# Patient Record
Sex: Female | Born: 1985 | Race: Black or African American | Hispanic: No | Marital: Single | Smoking: Former smoker
Health system: Southern US, Community
[De-identification: ages and names within clinical notes are randomized; demographics above are authoritative.]

## PROBLEM LIST (undated history)

## (undated) DIAGNOSIS — Z789 Other specified health status: Secondary | ICD-10-CM

## (undated) HISTORY — DX: Other specified health status: Z78.9

## (undated) HISTORY — PX: WRIST FRACTURE SURGERY: SHX121

---

## 2006-07-16 ENCOUNTER — Other Ambulatory Visit: Payer: Self-pay

## 2006-07-16 ENCOUNTER — Emergency Department: Payer: Self-pay | Admitting: Emergency Medicine

## 2006-12-10 ENCOUNTER — Ambulatory Visit: Payer: Self-pay | Admitting: Family Medicine

## 2008-05-29 ENCOUNTER — Emergency Department: Payer: Self-pay | Admitting: Emergency Medicine

## 2010-02-23 ENCOUNTER — Emergency Department: Payer: Self-pay | Admitting: Emergency Medicine

## 2011-07-18 DIAGNOSIS — Z8742 Personal history of other diseases of the female genital tract: Secondary | ICD-10-CM | POA: Insufficient documentation

## 2014-11-13 ENCOUNTER — Emergency Department: Payer: Self-pay | Admitting: Emergency Medicine

## 2015-02-08 ENCOUNTER — Emergency Department: Admit: 2015-02-08 | Discharge status: Against Medical Advice | Payer: Self-pay | Admitting: Emergency Medicine

## 2015-04-04 ENCOUNTER — Emergency Department
Admission: EM | Admit: 2015-04-04 | Discharge: 2015-04-04 | Disposition: A | Discharge status: Home / Self Care | Payer: Self-pay

## 2015-12-17 ENCOUNTER — Emergency Department
Admission: EM | Admit: 2015-12-17 | Discharge: 2015-12-17 | Disposition: A | Discharge status: Home / Self Care | Payer: 59 | Attending: Emergency Medicine | Admitting: Emergency Medicine

## 2015-12-17 DIAGNOSIS — M5442 Lumbago with sciatica, left side: Secondary | ICD-10-CM | POA: Diagnosis not present

## 2015-12-17 DIAGNOSIS — Z3202 Encounter for pregnancy test, result negative: Secondary | ICD-10-CM | POA: Insufficient documentation

## 2015-12-17 DIAGNOSIS — M545 Low back pain: Secondary | ICD-10-CM | POA: Diagnosis present

## 2015-12-17 LAB — POCT PREGNANCY, URINE: PREG TEST UR: NEGATIVE

## 2015-12-17 MED ORDER — HYDROCODONE-ACETAMINOPHEN 5-325 MG PO TABS: 1.0000 | ORAL_TABLET | Freq: Four times a day (QID) | ORAL | Status: DC | PRN

## 2015-12-17 MED ORDER — IBUPROFEN 800 MG PO TABS
ORAL_TABLET | ORAL | Status: AC
  Administered 2015-12-17: 800 mg via ORAL
  Filled 2015-12-17: qty 1

## 2015-12-17 MED ORDER — IBUPROFEN 800 MG PO TABS
800.0000 mg | ORAL_TABLET | ORAL | Status: AC
  Administered 2015-12-17: 800 mg via ORAL

## 2015-12-17 MED ORDER — PREDNISONE 20 MG PO TABS
40.0000 mg | ORAL_TABLET | Freq: Every day | ORAL | Status: DC
Start: 2015-12-17 — End: 2019-02-07

## 2015-12-17 NOTE — Discharge Instructions (Signed)
You have been seen in the Emergency Department (ED)  today for back pain.  Your workup and exam have not shown any acute abnormalities and you are likely suffering from muscle strain or possible problems with your discs, but there is no treatment that will fix your symptoms at this time.  Please take Motrin (ibuprofen) as needed for your pain according to the instructions written on the box.  Alternatively, for the next five days you can take  three times daily with meals (it may upset your stomach).  Take hydrocodone as prescribed for severe pain. Do not drink alcohol, drive or participate in any other potentially dangerous activities while taking this medication as it may make you sleepy. Do not take this medication with any other sedating medications, either prescription or over-the-counter. If you were prescribed Percocet or Vicodin, do not take these with acetaminophen (Tylenol) as it is already contained within these medications.   This medication is an opiate (or narcotic) pain medication and can be habit forming.  Use it as little as possible to achieve adequate pain control.  Do not use or use it with extreme caution if you have a history of opiate abuse or dependence.  If you are on a pain contract with your primary care doctor or a pain specialist, be sure to let them know you were prescribed this medication today from the [facility] Emergency Department.  This medication is intended for your use only - do not give any to anyone else and keep it in a secure place where nobody else, especially children, have access to it.  It will also cause or worsen constipation, so you may want to consider taking an over-the-counter stool softener while you are taking this medication.  Please follow up with your doctor as soon as possible regarding today's ED visit and your back pain.  Return to the ED for worsening back pain, fever, weakness or numbness of either leg, or if you develop either (1) an  inability to urinate or have bowel movements, or (2) loss of your ability to control your bathroom functions (if you start having "accidents"), or if you develop other new symptoms that concern you.   Sciatica Sciatica is pain, weakness, numbness, or tingling along the path of the sciatic nerve. The nerve starts in the lower back and runs down the back of each leg. The nerve controls the muscles in the lower leg and in the back of the knee, while also providing sensation to the back of the thigh, lower leg, and the sole of your foot. Sciatica is a symptom of another medical condition. For instance, nerve damage or certain conditions, such as a herniated disk or bone spur on the spine, pinch or put pressure on the sciatic nerve. This causes the pain, weakness, or other sensations normally associated with sciatica. Generally, sciatica only affects one side of the body. CAUSES   Herniated or slipped disc.  Degenerative disk disease.  A pain disorder involving the narrow muscle in the buttocks (piriformis syndrome).  Pelvic injury or fracture.  Pregnancy.  Tumor (rare). SYMPTOMS  Symptoms can vary from mild to very severe. The symptoms usually travel from the low back to the buttocks and down the back of the leg. Symptoms can include:  Mild tingling or dull aches in the lower back, leg, or hip.  Numbness in the back of the calf or sole of the foot.  Burning sensations in the lower back, leg, or hip.  Sharp pains  in the lower back, leg, or hip.  Leg weakness.  Severe back pain inhibiting movement. These symptoms may get worse with coughing, sneezing, laughing, or prolonged sitting or standing. Also, being overweight may worsen symptoms. DIAGNOSIS  Your caregiver will perform a physical exam to look for common symptoms of sciatica. He or she may ask you to do certain movements or activities that would trigger sciatic nerve pain. Other tests may be performed to find the cause of the  sciatica. These may include:  Blood tests.  X-rays.  Imaging tests, such as an MRI or CT scan. TREATMENT  Treatment is directed at the cause of the sciatic pain. Sometimes, treatment is not necessary and the pain and discomfort goes away on its own. If treatment is needed, your caregiver may suggest:  Over-the-counter medicines to relieve pain.  Prescription medicines, such as anti-inflammatory medicine, muscle relaxants, or narcotics.  Applying heat or ice to the painful area.  Steroid injections to lessen pain, irritation, and inflammation around the nerve.  Reducing activity during periods of pain.  Exercising and stretching to strengthen your abdomen and improve flexibility of your spine. Your caregiver may suggest losing weight if the extra weight makes the back pain worse.  Physical therapy.  Surgery to eliminate what is pressing or pinching the nerve, such as a bone spur or part of a herniated disk. HOME CARE INSTRUCTIONS   Only take over-the-counter or prescription medicines for pain or discomfort as directed by your caregiver.  Apply ice to the affected area for 20 minutes, 3-4 times a day for the first 48-72 hours. Then try heat in the same way.  Exercise, stretch, or perform your usual activities if these do not aggravate your pain.  Attend physical therapy sessions as directed by your caregiver.  Keep all follow-up appointments as directed by your caregiver.  Do not wear high heels or shoes that do not provide proper support.  Check your mattress to see if it is too soft. A firm mattress may lessen your pain and discomfort. SEEK IMMEDIATE MEDICAL CARE IF:   You lose control of your bowel or bladder (incontinence).  You have increasing weakness in the lower back, pelvis, buttocks, or legs.  You have redness or swelling of your back.  You have a burning sensation when you urinate.  You have pain that gets worse when you lie down or awakens you at  night.  Your pain is worse than you have experienced in the past.  Your pain is lasting longer than 4 weeks.  You are suddenly losing weight without reason. MAKE SURE YOU:  Understand these instructions.  Will watch your condition.  Will get help right away if you are not doing well or get worse.   This information is not intended to replace advice given to you by your health care provider. Make sure you discuss any questions you have with your health care provider.   Document Released: 10/15/2001 Document Revised: 07/12/2015 Document Reviewed: 03/01/2012 Elsevier Interactive Patient Education [employer]

## 2015-12-17 NOTE — ED Provider Notes (Signed)
[facility] Emergency Department Provider Note  ____________________________________________  Time seen: Approximately 6:33 PM  I have reviewed the triage vital signs and the nursing notes.   HISTORY  Chief Complaint Back Pain    HPI Crystal Choi is a 30 y.o. female denies previous medical history.  Patient reports that for about the last 2 weeks she was having pain in her lower back. Since in the area of her tailbone and lower buttock and frequently radiates down the left into the left leg in the back of the thigh. This is associated with a slight tingling sensation at times in the leg. No numbness or weakness. No trouble walking. Pain is worsened with standing and being at work. It is really some by resting.  No fevers chills or abdominal pain. She has not noticed any rash. No history of IV drug abuse or cancer. She has had back pain occasionally off and on throughout her life, but reports that the pain now seems to be worse. No trouble moving her bowel or bladder. No incontinence.   No past medical history on file.  There are no active problems to display for this patient.   No past surgical history on file.  Current Outpatient Rx  Name  Route  Sig  Dispense  Refill  . HYDROcodone-acetaminophen (NORCO/VICODIN) 5-325 MG tablet   Oral   Take 1 tablet by mouth every 6 (six) hours as needed for moderate pain.   20 tablet   0   . predniSONE (DELTASONE) 20 MG tablet   Oral   Take 2 tablets (40 mg total) by mouth daily with breakfast.   10 tablet   0     Allergies Review of patient's allergies indicates no known allergies.  No family history on file.  Social History Social History  Substance Use Topics  . Smoking status: Not on file  . Smokeless tobacco: Not on file  . Alcohol Use: Not on file    Review of Systems Constitutional: No fever/chills Eyes: No visual changes. ENT: No sore throat. Cardiovascular: Denies chest  pain. Respiratory: Denies shortness of breath. Gastrointestinal: No abdominal pain.  No nausea, no vomiting.  No diarrhea.  No constipation. Genitourinary: Negative for dysuria. Musculoskeletal: Negative for back pain in the upper or mid back, but pain is around the lower back and buttock. Skin: Negative for rash. Neurological: Negative for headaches, focal weakness or numbness.  10-point ROS otherwise negative.  ____________________________________________   PHYSICAL EXAM:  VITAL SIGNS: ED Triage Vitals  Enc Vitals Group     BP 12/17/15 1716 144/104 mmHg     Pulse Rate 12/17/15 1715 99     Resp 12/17/15 1715 18     Temp 12/17/15 1715 98.4 F (36.9 C)     Temp Source 12/17/15 1715 Oral     SpO2 12/17/15 1715 100 %     Weight 12/17/15 1715 242 lb (109.77 kg)     Height 12/17/15 1715  (1.651 m)     Head Cir --      Peak Flow --      Pain Score 12/17/15 1715 10     Pain Loc --      Pain Edu? --      Excl. in GC? --    Constitutional: Alert and oriented. Well appearing and in no acute distress. Eyes: Conjunctivae are normal. PERRL. EOMI. Head: Atraumatic. Nose: No congestion/rhinnorhea. Mouth/Throat: Mucous membranes are moist.  Oropharynx non-erythematous. Neck: No stridor.  Cardiovascular: Normal rate, regular rhythm. Grossly normal heart sounds.  Good peripheral circulation. Respiratory: Normal respiratory effort.  No retractions. Lungs CTAB. Gastrointestinal: Soft and nontender. No distention. No abdominal bruits. No CVA tenderness. Musculoskeletal: No lower extremity tenderness nor edema.  No joint effusions. Neurologic:  Normal speech and language. No gross focal neurologic deficits are appreciated. No gait instability. Normal patellar reflexes bilateral. Normal sensation in lower extremities with 5 out of 5 strength of major muscle groups of the lower legs bilateral. Rectal exam demonstrates normal tone and no saddle anesthesia, performed with Carollee Herter RN present.  Patient has no lesion edema or evidence of cellulitis over the lower back or buttock. Skin:  Skin is warm, dry and intact. No rash noted. Psychiatric: Mood and affect are normal. Speech and behavior are normal.  ____________________________________________   LABS (all labs ordered are listed, but only abnormal results are displayed)  Labs Reviewed  POC URINE PREG, ED  POCT PREGNANCY, URINE   ____________________________________________  EKG   ____________________________________________  RADIOLOGY  No trauma or evidence for emergent imaging. Reassuring neurologic exam of the lower extremities. No back pain "red flecks". Pregnancy test normal negative. ____________________________________________   PROCEDURES  Procedure(s) performed: None  Critical Care performed: No  ____________________________________________   INITIAL IMPRESSION / ASSESSMENT AND PLAN / ED COURSE  Pertinent labs & imaging results that were available during my care of the patient were reviewed by me and considered in my medical decision making (see chart for details).  Patient is for low back pain. Primarily has a sciatica type component to radiation down the left posterior leg. Reassuring exam. No evidence of acute neurologic abnormality or signs or symptoms to suggest acute cauda equina or severe herniated nucleus pulposus.  No fever no history of IV drug abuse. Patient awake alert normal hemodynamics.  Careful back pain precautions discussed with patient who is agreeable with plan, will follow up closely with orthopedics.  I will prescribe the patient a narcotic pain medicine due to their condition which I anticipate will cause at least moderate pain short term. I discussed with the patient safe use of narcotic pain medicines, and that they are not to drive, work in dangerous areas, or ever take more than prescribed (no more than 1 pill every 6 hours). We discussed that this is the type of medication  that can be  overdosed on and the risks of this type of medicine. Patient is very agreeable to only use as prescribed and to never use more than prescribed.  ____________________________________________   FINAL CLINICAL IMPRESSION(S) / ED DIAGNOSES  Final diagnoses:  Left-sided low back pain with left-sided sciatica      Sharyn Creamer, MD 12/17/15 2314

## 2015-12-17 NOTE — ED Notes (Signed)
Attended MD for gluteus exam at bedside

## 2015-12-17 NOTE — ED Notes (Signed)
Pt states that a couple weeks ago she started having lower back pain, denies injury, states pain is now in her left buttock with pain at times in her left leg

## 2017-10-11 ENCOUNTER — Emergency Department
Admission: EM | Admit: 2017-10-11 | Discharge: 2017-10-11 | Disposition: A | Discharge status: Home / Self Care | Payer: Medicaid Other | Attending: Emergency Medicine | Admitting: Emergency Medicine

## 2017-10-11 ENCOUNTER — Other Ambulatory Visit: Payer: Self-pay

## 2017-10-11 ENCOUNTER — Emergency Department: Payer: Medicaid Other

## 2017-10-11 DIAGNOSIS — Y92531 Health care provider office as the place of occurrence of the external cause: Secondary | ICD-10-CM | POA: Diagnosis not present

## 2017-10-11 DIAGNOSIS — Z79899 Other long term (current) drug therapy: Secondary | ICD-10-CM | POA: Insufficient documentation

## 2017-10-11 DIAGNOSIS — S0990XA Unspecified injury of head, initial encounter: Secondary | ICD-10-CM | POA: Diagnosis present

## 2017-10-11 DIAGNOSIS — Y9389 Activity, other specified: Secondary | ICD-10-CM | POA: Insufficient documentation

## 2017-10-11 DIAGNOSIS — Y838 Other surgical procedures as the cause of abnormal reaction of the patient, or of later complication, without mention of misadventure at the time of the procedure: Secondary | ICD-10-CM | POA: Insufficient documentation

## 2017-10-11 DIAGNOSIS — S02609B Fracture of mandible, unspecified, initial encounter for open fracture: Secondary | ICD-10-CM | POA: Diagnosis not present

## 2017-10-11 DIAGNOSIS — G4489 Other headache syndrome: Secondary | ICD-10-CM

## 2017-10-11 DIAGNOSIS — Y999 Unspecified external cause status: Secondary | ICD-10-CM | POA: Insufficient documentation

## 2017-10-11 LAB — COMPREHENSIVE METABOLIC PANEL
ALT: 14 U/L (ref 14–54)
AST: 16 U/L (ref 15–41)
Albumin: 3.5 g/dL (ref 3.5–5.0)
Alkaline Phosphatase: 48 U/L (ref 38–126)
Anion gap: 5 (ref 5–15)
BUN: 9 mg/dL (ref 6–20)
CO2: 25 mmol/L (ref 22–32)
Calcium: 9.1 mg/dL (ref 8.9–10.3)
Chloride: 108 mmol/L (ref 101–111)
Creatinine, Ser: 0.58 mg/dL (ref 0.44–1.00)
GFR calc Af Amer: >60 mL/min (ref >60)
GFR calc non Af Amer: >60 mL/min (ref >60)
Glucose, Bld: 96 mg/dL (ref 65–99)
Potassium: 3.8 mmol/L (ref 3.5–5.1)
Sodium: 138 mmol/L (ref 135–145)
Total Bilirubin: 1 mg/dL (ref 0.3–1.2)
Total Protein: 7.4 g/dL (ref 6.5–8.1)

## 2017-10-11 LAB — CBC WITH DIFFERENTIAL/PLATELET
Basophils Absolute: 0.1 10*3/uL (ref 0–0.1)
Basophils Relative: 1 %
Eosinophils Absolute: 0 10*3/uL (ref 0–0.7)
Eosinophils Relative: 0 %
HCT: 36.9 % (ref 35.0–47.0)
Hemoglobin: 12.1 g/dL (ref 12.0–16.0)
Lymphocytes Relative: 12 %
Lymphs Abs: 1.3 10*3/uL (ref 1.0–3.6)
MCH: 28.2 pg (ref 26.0–34.0)
MCHC: 32.8 g/dL (ref 32.0–36.0)
MCV: 85.9 fL (ref 80.0–100.0)
Monocytes Absolute: 0.4 10*3/uL (ref 0.2–0.9)
Monocytes Relative: 4 %
Neutro Abs: 8.6 10*3/uL — ABNORMAL HIGH (ref 1.4–6.5)
Neutrophils Relative %: 83 %
Platelets: 194 10*3/uL (ref 150–440)
RBC: 4.29 MIL/uL (ref 3.80–5.20)
RDW: 14.5 % (ref 11.5–14.5)
WBC: 10.3 10*3/uL (ref 3.6–11.0)

## 2017-10-11 MED ORDER — PROMETHAZINE HCL 25 MG/ML IJ SOLN
12.5000 mg | Freq: Once | INTRAMUSCULAR | Status: AC
  Administered 2017-10-11: 12.5 mg via INTRAMUSCULAR
  Filled 2017-10-11: qty 1

## 2017-10-11 MED ORDER — AMOXICILLIN-POT CLAVULANATE 875-125 MG PO TABS: 1.0000 | ORAL_TABLET | Freq: Two times a day (BID) | ORAL | 0 refills | Status: AC

## 2017-10-11 NOTE — ED Provider Notes (Signed)
[facility] Emergency Department Provider Note  ____________________________________________  Time seen: Approximately 5:25 PM  I have reviewed the triage vital signs and the nursing notes.   HISTORY  Chief Complaint Headache and Emesis    HPI Crystal Choi is a 31 y.o. female presents to the emergency department with 10 out of 10 left-sided frontal headache with new onset left blurry vision that started last night.  Patient reports that one month ago, patient had wisdom teeth extracted from the left lower jaw.  Patient reports that she has had mild, intermittent left-sided headaches since that time but nothing severe.  Patient reports   that current headache feels like "something is wrong".  She reports 2 episodes of emesis. she has been ambulating without difficulty denies weakness.  No history of migraines. No history of essential hypertension.    No past medical history on file.  There are no active problems to display for this patient.   Prior to Admission medications   Medication Sig Start Date End Date Taking? Authorizing Provider  amoxicillin-clavulanate (AUGMENTIN) 875-125 MG tablet Take 1 tablet by mouth 2 (two) times daily for 10 days. 10/11/17 10/21/17  Orvil Feil, PA-C  HYDROcodone-acetaminophen (NORCO/VICODIN) 5-325 MG tablet Take 1 tablet by mouth every 6 (six) hours as needed for moderate pain. 12/17/15   Sharyn Creamer, MD  predniSONE (DELTASONE) 20 MG tablet Take 2 tablets (40 mg total) by mouth daily with breakfast. 12/17/15   Sharyn Creamer, MD    Allergies Patient has no known allergies.  No family history on file.  Social History Social History   Tobacco Use  . Smoking status: Never Smoker  . Smokeless tobacco: Never Used  Substance Use Topics  . Alcohol use: No    Frequency: Never  . Drug use: No     Review of Systems  Constitutional: No fever/chills Eyes: Patient has mild left eye blurry vision.  ENT: No upper  respiratory complaints. Cardiovascular: no chest pain. Respiratory: no cough. No SOB. Gastrointestinal: No abdominal pain.  No nausea, no vomiting.  No diarrhea.  No constipation. Musculoskeletal: Negative for musculoskeletal pain. Skin: Negative for rash, abrasions, lacerations, ecchymosis. Neurological: Patient has headache, no focal weakness or numbness.  ____________________________________________   PHYSICAL EXAM:  VITAL SIGNS: ED Triage Vitals  Enc Vitals Group     BP 10/11/17 1442 (!) 185/108     Pulse Rate 10/11/17 1442 80     Resp 10/11/17 1442 18     Temp 10/11/17 1442 97.6 F (36.4 C)     Temp Source 10/11/17 1442 Oral     SpO2 10/11/17 1442 100 %     Weight 10/11/17 1443 220 lb (99.8 kg)     Height 10/11/17 1443 5\' 6"  (1.676 m)     Head Circumference --      Peak Flow --      Pain Score 10/11/17 1448 7     Pain Loc --      Pain Edu? --      Excl. in GC? --      Constitutional: Alert and oriented. Well appearing and in no acute distress. Eyes: Conjunctivae are normal. PERRL. EOMI. Head: Atraumatic. ENT:      Ears: TMs are pearly bilaterally.      Nose: No congestion/rhinnorhea.      Mouth/Throat: Mucous membranes are moist.  Neck: No stridor.  Full range of motion. Hematological/Lymphatic/Immunilogical: No cervical lymphadenopathy.  Cardiovascular: Normal rate, regular rhythm. Normal S1 and S2.  Good peripheral circulation. Respiratory: Normal respiratory effort without tachypnea or retractions. Lungs CTAB. Good air entry to the bases with no decreased or absent breath sounds. Gastrointestinal: Bowel sounds 4 quadrants. Soft and nontender to palpation. No guarding or rigidity. No palpable masses. No distention. No CVA tenderness. Musculoskeletal: Full range of motion to all extremities. No gross deformities appreciated. Neurologic:  Normal speech and language. No gross focal neurologic deficits are appreciated.  Skin:  Skin is warm, dry and intact. No rash  noted. Psychiatric: Mood and affect are normal. Speech and behavior are normal. Patient exhibits appropriate insight and judgement.   ____________________________________________   LABS (all labs ordered are listed, but only abnormal results are displayed)  Labs Reviewed  CBC WITH DIFFERENTIAL/PLATELET - Abnormal; Notable for the following components:      Result Value   Neutro Abs 8.6 (*)    All other components within normal limits  COMPREHENSIVE METABOLIC PANEL   ____________________________________________  EKG   ____________________________________________  RADIOLOGY Geraldo PitterI, Monicka Cyran M Danity Schmelzer, personally viewed and evaluated these images (plain radiographs) as part of my medical decision making, as well as reviewing the written report by the radiologist.    Ct Head Wo Contrast  Result Date: 10/11/2017 CLINICAL DATA:  Headache and left-sided facial pain. Recent third molar extractions. EXAM: CT HEAD WITHOUT CONTRAST CT MAXILLOFACIAL WITHOUT CONTRAST TECHNIQUE: Multidetector CT imaging of the head and maxillofacial structures were performed using the standard protocol without intravenous contrast. Multiplanar CT image reconstructions of the maxillofacial structures were also generated. COMPARISON:  None. FINDINGS: CT HEAD FINDINGS Brain: The ventricles are normal in size and configuration. There is no intracranial mass, hemorrhage, extra-axial fluid collection, or midline shift. Gray-white compartments are normal. No evident acute infarct. Vascular: No hyperdense vessel. No arteriovascular calcification evident. Skull: Bony calvarium appears intact. Other: Mastoid air cells are clear. CT MAXILLOFACIAL FINDINGS Osseous: No acute fracture or dislocation. There is evidence of prior third molar extraction on the left superiorly and inferiorly with bony remodeling in these areas. Marked bony thinning is noted in the areas of extraction with nonspecific soft tissue material in these areas, likely  developing granulation tissue. There is a subtle fracture along the posteromedial aspect of the extraction site in the right mandible, nondisplaced. No other fracture is evident. No dislocation. There are no blastic or lytic bone lesions. Orbits: Orbits appear symmetric bilaterally. No intraorbital lesions are evident. Sinuses: There is mucosal thickening and opacification in multiple ethmoid air cells bilaterally. There is mucosal thickening anteriorly in the sphenoid sinuses bilaterally. There is mucosal thickening in the maxillary antra bilaterally, more severe on the right posteriorly and medially than elsewhere. No air-fluid levels are noted. No bony destruction or expansion is noted paranasal sinus regions. There is ostiomeatal unit obstruction bilaterally. There is rightward deviation of the nasal septum. There is no nasal cavity obstruction. Soft tissues: No well-defined abscess seen. No soft tissue mass or hematoma. No adenopathy is noted on either side. Tongue and tongue base regions appear normal. Visualized pharynx appears normal. Visualized cervical spine appears normal. IMPRESSION: CT head:  Study within normal limits. CT maxillofacial: 1. Bony thinning and remodeling in areas of left and right third molar extractions. Nonspecific soft tissue material is present in the areas of extraction. A subtle nondisplaced fracture is noted in the posteromedial left mandible at the third molar extraction site, best seen on axial slice 47 series 7 and coronal slice 39 series 12. No other fracture evident. 2. No soft tissue abscess appreciable. No  appreciable soft tissue swelling or soft tissue hematoma demonstrated on this study. 3. Multifocal paranasal sinus disease. No air-fluid level. No bony destruction or expansion in the paranasal sinus regions. Note that there is ostiomeatal unit obstruction bilaterally. There is rightward deviation of the nasal septum. 4.  No evident adenopathy. Electronically Signed   By:  Bretta Bang III M.D.   On: 10/11/2017 17:57   Ct Maxillofacial Wo Contrast  Result Date: 10/11/2017 CLINICAL DATA:  Headache and left-sided facial pain. Recent third molar extractions. EXAM: CT HEAD WITHOUT CONTRAST CT MAXILLOFACIAL WITHOUT CONTRAST TECHNIQUE: Multidetector CT imaging of the head and maxillofacial structures were performed using the standard protocol without intravenous contrast. Multiplanar CT image reconstructions of the maxillofacial structures were also generated. COMPARISON:  None. FINDINGS: CT HEAD FINDINGS Brain: The ventricles are normal in size and configuration. There is no intracranial mass, hemorrhage, extra-axial fluid collection, or midline shift. Gray-white compartments are normal. No evident acute infarct. Vascular: No hyperdense vessel. No arteriovascular calcification evident. Skull: Bony calvarium appears intact. Other: Mastoid air cells are clear. CT MAXILLOFACIAL FINDINGS Osseous: No acute fracture or dislocation. There is evidence of prior third molar extraction on the left superiorly and inferiorly with bony remodeling in these areas. Marked bony thinning is noted in the areas of extraction with nonspecific soft tissue material in these areas, likely developing granulation tissue. There is a subtle fracture along the posteromedial aspect of the extraction site in the right mandible, nondisplaced. No other fracture is evident. No dislocation. There are no blastic or lytic bone lesions. Orbits: Orbits appear symmetric bilaterally. No intraorbital lesions are evident. Sinuses: There is mucosal thickening and opacification in multiple ethmoid air cells bilaterally. There is mucosal thickening anteriorly in the sphenoid sinuses bilaterally. There is mucosal thickening in the maxillary antra bilaterally, more severe on the right posteriorly and medially than elsewhere. No air-fluid levels are noted. No bony destruction or expansion is noted paranasal sinus regions. There  is ostiomeatal unit obstruction bilaterally. There is rightward deviation of the nasal septum. There is no nasal cavity obstruction. Soft tissues: No well-defined abscess seen. No soft tissue mass or hematoma. No adenopathy is noted on either side. Tongue and tongue base regions appear normal. Visualized pharynx appears normal. Visualized cervical spine appears normal. IMPRESSION: CT head:  Study within normal limits. CT maxillofacial: 1. Bony thinning and remodeling in areas of left and right third molar extractions. Nonspecific soft tissue material is present in the areas of extraction. A subtle nondisplaced fracture is noted in the posteromedial left mandible at the third molar extraction site, best seen on axial slice 47 series 7 and coronal slice 39 series 12. No other fracture evident. 2. No soft tissue abscess appreciable. No appreciable soft tissue swelling or soft tissue hematoma demonstrated on this study. 3. Multifocal paranasal sinus disease. No air-fluid level. No bony destruction or expansion in the paranasal sinus regions. Note that there is ostiomeatal unit obstruction bilaterally. There is rightward deviation of the nasal septum. 4.  No evident adenopathy. Electronically Signed   By: Bretta Bang III M.D.   On: 10/11/2017 17:57    ____________________________________________    PROCEDURES  Procedure(s) performed:    Procedures    Medications  promethazine (PHENERGAN) injection 12.5 mg (12.5 mg Intramuscular Given 10/11/17 1712)     ____________________________________________   INITIAL IMPRESSION / ASSESSMENT AND PLAN / ED COURSE  Pertinent labs & imaging results that were available during my care of the patient were reviewed by me and  considered in my medical decision making (see chart for details).  Review of the [place] CSRS was performed in accordance of the [facility] prior to dispensing any controlled drugs.     Assessment and plan Headache Patient presents to the  emergency department with concern for 10 out of 10 headache.  No neurologic deficits were identified on physical exam.  CT head revealed no evidence of intracranial hemorrhage.  CT maxillofacial revealed a lucency consistent with a left mandibular fracture that likely occurred during tooth extraction.  Patient was discharged with Augmentin for open fracture.  Sinusitis was also identified during CT examination.  Patient's headache improved significantly in the emergency department with Phenergan.  All patient questions were answered.    ____________________________________________  FINAL CLINICAL IMPRESSION(S) / ED DIAGNOSES  Final diagnoses:  Open fracture of left side of mandible, unspecified mandibular site, initial encounter (HCC)  Other headache syndrome      NEW MEDICATIONS STARTED DURING THIS VISIT:  ED Discharge Orders        Ordered    amoxicillin-clavulanate (AUGMENTIN) 875-125 MG tablet  2 times daily     10/11/17 1814          This chart was dictated using voice recognition software/Dragon. Despite best efforts to proofread, errors can occur which can change the meaning. Any change was purely unintentional.    Orvil FeilWoods, Jeweliana Dudgeon M, PA-C 10/11/17 1946    Myrna BlazerSchaevitz, David Matthew, MD 10/11/17 2245

## 2017-10-11 NOTE — ED Triage Notes (Signed)
She arrives today with reports of headache pain that began last night - today while driving to work she began vomiting  Emesis x2 today   She recently had her wisdom teeth extracted on 09/10/2017 and she feels as if she has infection to the site

## 2019-02-06 ENCOUNTER — Emergency Department (HOSPITAL_COMMUNITY)
Admission: EM | Admit: 2019-02-06 | Discharge: 2019-02-06 | Disposition: A | Discharge status: Home / Self Care | Payer: No Typology Code available for payment source | Attending: Emergency Medicine | Admitting: Emergency Medicine

## 2019-02-06 ENCOUNTER — Other Ambulatory Visit: Payer: Self-pay

## 2019-02-06 ENCOUNTER — Emergency Department (HOSPITAL_COMMUNITY): Payer: No Typology Code available for payment source

## 2019-02-06 DIAGNOSIS — M542 Cervicalgia: Secondary | ICD-10-CM | POA: Diagnosis not present

## 2019-02-06 DIAGNOSIS — Y939 Activity, unspecified: Secondary | ICD-10-CM | POA: Insufficient documentation

## 2019-02-06 DIAGNOSIS — S20319A Abrasion of unspecified front wall of thorax, initial encounter: Secondary | ICD-10-CM | POA: Diagnosis not present

## 2019-02-06 DIAGNOSIS — Y929 Unspecified place or not applicable: Secondary | ICD-10-CM | POA: Insufficient documentation

## 2019-02-06 DIAGNOSIS — T07XXXA Unspecified multiple injuries, initial encounter: Secondary | ICD-10-CM | POA: Diagnosis present

## 2019-02-06 DIAGNOSIS — S8001XA Contusion of right knee, initial encounter: Secondary | ICD-10-CM | POA: Diagnosis not present

## 2019-02-06 DIAGNOSIS — Y999 Unspecified external cause status: Secondary | ICD-10-CM | POA: Insufficient documentation

## 2019-02-06 DIAGNOSIS — S6991XA Unspecified injury of right wrist, hand and finger(s), initial encounter: Secondary | ICD-10-CM | POA: Insufficient documentation

## 2019-02-06 DIAGNOSIS — S63641A Sprain of metacarpophalangeal joint of right thumb, initial encounter: Secondary | ICD-10-CM

## 2019-02-06 NOTE — Discharge Instructions (Signed)
Your x-rays did not show any abnormality of your bones.  There was some soft tissue swelling noted.  I would take 600 mg ibuprofen every 6 hours as needed for pain.  I expect you to have continued aches/pains for the next several days up to about a week.  Activity will be limited by how much discomfort you have (if it hurts too much then don't do it). Return for evaluation for significantly worsening pain, HA, shortness or breath, lightheadedness or anything else you feel you need emergent re-evaluation).

## 2019-02-06 NOTE — Progress Notes (Signed)
Pt's mother, Barron Schmid, cell: [phone]--aware of no visitor policy.

## 2019-02-06 NOTE — ED Triage Notes (Signed)
Pt to room from xray

## 2019-02-06 NOTE — ED Notes (Signed)
Patient verbalizes understanding of discharge instructions . Opportunity for questions and answers were provided . Armband removed by staff ,Pt discharged from ED. W/C  offered at D/C  and Declined W/C at D/C and was escorted to lobby by RN.  
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
## 2019-02-07 ENCOUNTER — Emergency Department: Payer: No Typology Code available for payment source

## 2019-02-07 ENCOUNTER — Other Ambulatory Visit: Payer: Self-pay

## 2019-02-07 ENCOUNTER — Encounter: Payer: Self-pay | Admitting: Emergency Medicine

## 2019-02-07 ENCOUNTER — Emergency Department
Admission: EM | Admit: 2019-02-07 | Discharge: 2019-02-07 | Disposition: A | Discharge status: Home / Self Care | Payer: No Typology Code available for payment source | Attending: Emergency Medicine | Admitting: Emergency Medicine

## 2019-02-07 DIAGNOSIS — M79644 Pain in right finger(s): Secondary | ICD-10-CM | POA: Diagnosis not present

## 2019-02-07 DIAGNOSIS — S40012A Contusion of left shoulder, initial encounter: Secondary | ICD-10-CM | POA: Diagnosis not present

## 2019-02-07 DIAGNOSIS — T07XXXA Unspecified multiple injuries, initial encounter: Secondary | ICD-10-CM

## 2019-02-07 DIAGNOSIS — Y939 Activity, unspecified: Secondary | ICD-10-CM | POA: Insufficient documentation

## 2019-02-07 DIAGNOSIS — T148XXA Other injury of unspecified body region, initial encounter: Secondary | ICD-10-CM | POA: Insufficient documentation

## 2019-02-07 DIAGNOSIS — S4992XA Unspecified injury of left shoulder and upper arm, initial encounter: Secondary | ICD-10-CM | POA: Diagnosis present

## 2019-02-07 DIAGNOSIS — Y999 Unspecified external cause status: Secondary | ICD-10-CM | POA: Insufficient documentation

## 2019-02-07 DIAGNOSIS — R030 Elevated blood-pressure reading, without diagnosis of hypertension: Secondary | ICD-10-CM | POA: Diagnosis not present

## 2019-02-07 DIAGNOSIS — Y92410 Unspecified street and highway as the place of occurrence of the external cause: Secondary | ICD-10-CM | POA: Insufficient documentation
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
## 2019-02-07 NOTE — Discharge Instructions (Signed)
Follow-up with your primary care provider if any continued problems.  Also your blood pressure was elevated in the emergency department.  Your primary care can recheck your blood pressure and if continued to stay elevated start treatment for hypertension.  It is not unusual for your blood pressure to elevate if you are having pain.  Wear thumb splint for support and protection no more than 5 to 7 days.  You may also use ice to your clavicle to reduce pain and swelling.
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
## 2021-05-09 ENCOUNTER — Emergency Department: Payer: Medicaid Other

## 2021-05-09 ENCOUNTER — Other Ambulatory Visit: Payer: Self-pay

## 2021-05-09 ENCOUNTER — Encounter: Payer: Self-pay | Admitting: Emergency Medicine

## 2021-05-09 DIAGNOSIS — Z3A18 18 weeks gestation of pregnancy: Secondary | ICD-10-CM | POA: Diagnosis not present

## 2021-05-09 DIAGNOSIS — D72829 Elevated white blood cell count, unspecified: Secondary | ICD-10-CM | POA: Diagnosis not present

## 2021-05-09 DIAGNOSIS — O26892 Other specified pregnancy related conditions, second trimester: Secondary | ICD-10-CM | POA: Diagnosis not present

## 2021-05-09 DIAGNOSIS — O219 Vomiting of pregnancy, unspecified: Secondary | ICD-10-CM | POA: Insufficient documentation

## 2021-05-09 DIAGNOSIS — R102 Pelvic and perineal pain: Secondary | ICD-10-CM | POA: Insufficient documentation

## 2021-05-09 DIAGNOSIS — R1030 Lower abdominal pain, unspecified: Secondary | ICD-10-CM | POA: Diagnosis not present

## 2021-05-09 LAB — COMPREHENSIVE METABOLIC PANEL
ALT: 14 U/L (ref 0–44)
AST: 14 U/L — ABNORMAL LOW (ref 15–41)
Albumin: 3.2 g/dL — ABNORMAL LOW (ref 3.5–5.0)
Alkaline Phosphatase: 36 U/L — ABNORMAL LOW (ref 38–126)
Anion gap: 9 (ref 5–15)
BUN: 7 mg/dL (ref 6–20)
CO2: 22 mmol/L (ref 22–32)
Calcium: 9 mg/dL (ref 8.9–10.3)
Chloride: 103 mmol/L (ref 98–111)
Creatinine, Ser: 0.6 mg/dL (ref 0.44–1.00)
GFR, Estimated: >60 mL/min (ref >60)
Glucose, Bld: 112 mg/dL — ABNORMAL HIGH (ref 70–99)
Potassium: 3.8 mmol/L (ref 3.5–5.1)
Sodium: 134 mmol/L — ABNORMAL LOW (ref 135–145)
Total Bilirubin: 0.5 mg/dL (ref 0.3–1.2)
Total Protein: 6.6 g/dL (ref 6.5–8.1)

## 2021-05-09 LAB — CBC
HCT: 35 % — ABNORMAL LOW (ref 36.0–46.0)
Hemoglobin: 11.5 g/dL — ABNORMAL LOW (ref 12.0–15.0)
MCH: 28.3 pg (ref 26.0–34.0)
MCHC: 32.9 g/dL (ref 30.0–36.0)
MCV: 86 fL (ref 80.0–100.0)
Platelets: 191 10*3/uL (ref 150–400)
RBC: 4.07 MIL/uL (ref 3.87–5.11)
RDW: 14.4 % (ref 11.5–15.5)
WBC: 14.7 10*3/uL — ABNORMAL HIGH (ref 4.0–10.5)
nRBC: 0 % (ref 0.0–0.2)

## 2021-05-09 LAB — URINALYSIS, COMPLETE (UACMP) WITH MICROSCOPIC
Bacteria, UA: NONE SEEN
Bilirubin Urine: NEGATIVE
Glucose, UA: NEGATIVE mg/dL
Ketones, ur: NEGATIVE mg/dL
Leukocytes,Ua: NEGATIVE
Nitrite: NEGATIVE
Protein, ur: NEGATIVE mg/dL
Specific Gravity, Urine: 1.004 — ABNORMAL LOW (ref 1.005–1.030)
pH: 6 (ref 5.0–8.0)

## 2021-05-09 LAB — ABO/RH: ABO/RH(D): O POS

## 2021-05-09 LAB — HCG, QUANTITATIVE, PREGNANCY: hCG, Beta Chain, Quant, S: 39129 m[IU]/mL — ABNORMAL HIGH (ref <5)

## 2021-05-09 NOTE — ED Triage Notes (Signed)
Pt in with co lower abd pain, pt is approx [redacted] weeks pregnant but has not seen OB yet. Pt denies any vag bleeding or fluid leaking. Pt is G2P1.

## 2021-05-09 NOTE — ED Notes (Signed)
Unable to get FHT at this time, [place] ordered per Dr. Larinda Buttery

## 2021-05-10 ENCOUNTER — Emergency Department
Admission: EM | Admit: 2021-05-10 | Discharge: 2021-05-10 | Disposition: A | Discharge status: Home / Self Care | Payer: Medicaid Other | Attending: Emergency Medicine | Admitting: Emergency Medicine

## 2021-05-10 DIAGNOSIS — R1030 Lower abdominal pain, unspecified: Secondary | ICD-10-CM

## 2021-05-10 DIAGNOSIS — O26892 Other specified pregnancy related conditions, second trimester: Secondary | ICD-10-CM

## 2021-05-10 LAB — WET PREP, GENITAL
Clue Cells Wet Prep HPF POC: NONE SEEN
Sperm: NONE SEEN
Trich, Wet Prep: NONE SEEN
WBC, Wet Prep HPF POC: NONE SEEN
Yeast Wet Prep HPF POC: NONE SEEN

## 2021-05-10 LAB — CHLAMYDIA/NGC RT PCR (ARMC ONLY)
Chlamydia Tr: NOT DETECTED
N gonorrhoeae: NOT DETECTED

## 2021-05-10 MED ORDER — ACETAMINOPHEN 500 MG PO TABS
1000.0000 mg | ORAL_TABLET | Freq: Once | ORAL | Status: AC
  Administered 2021-05-10: 1000 mg via ORAL
  Filled 2021-05-10: qty 2

## 2021-05-10 NOTE — ED Provider Notes (Signed)
[facility] Emergency [facility] Provider Note  ____________________________________________   Event Date/Time   First MD Initiated Contact with Patient 05/10/21 [phone]     (approximate)  I have reviewed the triage vital signs and the nursing notes.   HISTORY  Chief Complaint Abdominal Pain    HPI Choi Choi is a 35 y.o. female [facility]oi with lower abdominal pressure that is worse with walking today and vaginal pressure.  States she is about [redacted] weeks pregnant and has her first OB/GYN appointment with [facility] on July 20.  She states she was concerned because of this pressure and just wanted to be checked out.  Symptoms have improved.  No fevers, diarrhea, dysuria, hematuria, vaginal bleeding or discharge.  Still having occasional nausea and vomiting in the morning.  Last vomited about 24 hours ago.     No past medical history on file.  There are no problems to display for this patient.   Past Surgical History:  Procedure Laterality Date   CESAREAN SECTION     WRIST FRACTURE SURGERY      Prior to Admission medications   Not on File    Allergies Patient has no known allergies.  No family history on file.  Social History Social History   Tobacco Use   Smoking status: Never   Smokeless tobacco: Never  Vaping Use   Vaping Use: Never used  Substance Use Topics   Alcohol use: No   Drug use: No    Review of Systems Constitutional: No fever. Eyes: No visual changes. ENT: No sore throat. Cardiovascular: Denies chest pain. Respiratory: Denies shortness of breath. Gastrointestinal: + nausea, vomiting.  No diarrhea. Genitourinary: Negative for dysuria. Musculoskeletal: Negative for back pain. Skin: Negative for rash. Neurological: Negative for focal weakness or numbness.  ____________________________________________   PHYSICAL EXAM:  VITAL  SIGNS: ED Triage Vitals  Enc Vitals Group     BP 05/09/21 2220 (!) 146/99     Pulse Rate 05/09/21 2220 (!) 117     Resp 05/09/21 2220 20     Temp 05/09/21 2220 98.3 F (36.8 C)     Temp Source 05/09/21 2220 Oral     SpO2 05/09/21 2220 99 %     Weight 05/09/21 2221 245 lb (111.1 kg)     Height 05/09/21 2221 5\' 5"  (1.651 m)     Head Circumference --      Peak Flow --      Pain Score 05/09/21 2220 6     Pain Loc --      Pain Edu? --      Excl. in GC? --    CONSTITUTIONAL: Alert and oriented and responds appropriately to questions. Well-appearing; well-nourished, afebrile, obese, nontoxic-appearing HEAD: Normocephalic EYES: Conjunctivae clear, pupils appear equal, EOM appear intact ENT: normal nose; moist mucous membranes NECK: Supple, normal ROM CARD: Regular and tachycardic; S1 and S2 appreciated; no murmurs, no clicks, no rubs, no gallops RESP: Normal chest excursion without splinting or tachypnea; breath sounds clear and equal bilaterally; no wheezes, no rhonchi, no rales, no hypoxia or respiratory distress, speaking full sentences ABD/GI: Normal bowel sounds; gravid uterus, nontender to palpation, no tenderness at McBurney's point, negative Murphy sign BACK: The back appears normal EXT: Normal ROM in all joints; no deformity noted, no edema; no cyanosis SKIN: Normal color for age and race; warm; no rash on exposed skin NEURO: Moves all extremities  equally PSYCH: The patient's mood and manner are appropriate.  ____________________________________________   LABS (all labs ordered are listed, but only abnormal results are displayed)  Labs Reviewed  CBC - Abnormal; Notable for the following components:      Result Value   WBC 14.7 (*)    Hemoglobin 11.5 (*)    HCT 35.0 (*)    All other components within normal limits  COMPREHENSIVE METABOLIC PANEL - Abnormal; Notable for the following components:   Sodium 134 (*)    Glucose, Bld 112 (*)    Albumin 3.2 (*)    AST 14 (*)     Alkaline Phosphatase 36 (*)    All other components within normal limits  HCG, QUANTITATIVE, PREGNANCY - Abnormal; Notable for the following components:   hCG, Beta Chain, Quant, S 39,129 (*)    All other components within normal limits  URINALYSIS, COMPLETE (UACMP) WITH MICROSCOPIC - Abnormal; Notable for the following components:   Color, Urine STRAW (*)    APPearance CLEAR (*)    Specific Gravity, Urine 1.004 (*)    Hgb urine dipstick SMALL (*)    All other components within normal limits  WET PREP, GENITAL  CHLAMYDIA/NGC RT PCR ([facility] ONLY)            ABO/RH   ____________________________________________  EKG   ____________________________________________  RADIOLOGY I, Connelly [place], personally viewed and evaluated these images (plain radiographs) as part of my medical decision making, as well as reviewing the written report by the radiologist.  ED MD interpretation: Single intrauterine pregnancy with normal fetal heart rate and movement.  Official radiology report(s): US OB Limited  Result Date: 05/09/2021 CLINICAL DATA:  Lower abdominal pain tonight. No bleeding. No prenatal care. Estimated gestational age by LMP is 18 weeks 1 day. EXAM: LIMITED OBSTETRIC ULTRASOUND COMPARISON:  None. FINDINGS: Number of Fetuses: 1 Heart Rate:  152 bpm Movement: Fetal movement is observed. Presentation: Transverse lie with head to the maternal left. Placental Location: Posterior Previa: Placenta is low lying, measuring about 2 cm from the internal cervical os. Amniotic Fluid (Subjective):  Within normal limits. BPD: 4.4 cm 19 w  2 d MATERNAL FINDINGS: Cervix:  Appears closed. Uterus/Adnexae: Both ovaries are visualized and appear normal. Corpus luteal cyst on the left. Small fibroid is noted in the anterior uterus measuring about 1.9 cm maximal diameter. IMPRESSION: Single intrauterine pregnancy. Low lying posterior placenta measuring about 2 cm from the internal os. Fetal motion and fetal cardiac  activity are observed. This exam is performed on an emergent basis and does not comprehensively evaluate fetal size, dating, or anatomy; follow-up complete OB US should be considered if further fetal assessment is warranted. Electronically Signed   By: Burman Nieves M.D.   On: 05/09/2021 23:50    ____________________________________________   PROCEDURES  Procedure(s) performed (including Critical Care):  Procedures   ____________________________________________   INITIAL IMPRESSION / ASSESSMENT AND PLAN / ED COURSE  As part of my medical decision making, I reviewed the following data within the electronic MEDICAL RECORD NUMBER Nursing notes reviewed and incorporated, Labs reviewed , Old chart reviewed, Radiograph reviewed , and Notes from prior ED visits         Patient here with lower abdominal pain in pregnancy.  Labs show minimal leukocytosis which may be secondary to pregnancy.  She is afebrile here.  Abdominal exam is benign.  Low suspicion for cholecystitis, appendicitis.  Urine does not appear infected today.  Blood pressure slightly elevated as well  as heart rate on arrival.  We will recheck.  There is no protein in her urine.  OB ultrasound shows single intrauterine pregnancy with normal fetal cardiac activity and movement.  She agrees to pelvic exam with cultures today.  Have offered HIV and syphilis testing which she declines as she has follow-up with her OB/GYN scheduled on July 20.  States she had not been seen sooner due to trying to get her "insurance straight". We will give Tylenol for pain and reassess.  ED PROGRESS  Patient's wet prep is unremarkable.  GC and chlamydia pending.  Pelvic exam unremarkable today.  No signs of PID.  Cervix is closed.  No bleeding.  She has had a couple of blood pressures that have been elevated but on my recheck it is 131/85.  She has follow-up with OB/GYN already scheduled.  She is well-appearing here and comfortable with plan for discharge  home.  Discussed return precautions.  Recommended over-the-counter Tylenol as needed for pain.  At this time, I do not feel there is any life-threatening condition present. I have reviewed, interpreted and discussed all results (EKG, imaging, lab, urine as appropriate) and exam findings with patient/family. I have reviewed nursing notes and appropriate previous records.  I feel the patient is safe to be discharged home without further emergent workup and can continue workup as an outpatient as needed. Discussed usual and customary return precautions. Patient/family verbalize understanding and are comfortable with this plan.  Outpatient follow-up has been provided as needed. All questions have been answered.  ____________________________________________   FINAL CLINICAL IMPRESSION(S) / ED DIAGNOSES  Final diagnoses:  Abdominal pain during pregnancy in second trimester     ED Discharge Orders     None       *Please note:  Charline Odell Fasching was evaluated in Emergency [facility] on 05/10/2021 for the symptoms described in the history of present illness. She was evaluated in the context of the global COVID-19 pandemic, which necessitated consideration that the patient might be at risk for infection with the SARS-CoV-2 virus that causes COVID-19. Institutional protocols and algorithms that pertain to the evaluation of patients at risk for COVID-19 are in a state of rapid change based on information released by regulatory bodies including the CDC and federal and state organizations. These policies and algorithms were followed during the patient's care in the ED.  Some ED evaluations and interventions may be delayed as a result of limited staffing during and the pandemic.*   Note:  This document was prepared using Dragon voice recognition software and may include unintentional dictation errors.    Arriah Wadle, Layla Maw, DO 05/10/21 [phone]

## 2021-05-10 NOTE — Discharge Instructions (Addendum)
You may take Tylenol 1000 mg every 6 hours as needed for pain. °

## 2021-05-23 ENCOUNTER — Encounter: Payer: Self-pay | Admitting: Obstetrics and Gynecology

## 2021-05-23 ENCOUNTER — Other Ambulatory Visit: Payer: Self-pay

## 2021-05-23 ENCOUNTER — Ambulatory Visit (INDEPENDENT_AMBULATORY_CARE_PROVIDER_SITE_OTHER): Payer: Medicaid Other | Admitting: Obstetrics and Gynecology

## 2021-05-23 VITALS — BP 149/94 | Ht 65.0 in | Wt 255.6 lb

## 2021-05-23 DIAGNOSIS — O0990 Supervision of high risk pregnancy, unspecified, unspecified trimester: Secondary | ICD-10-CM

## 2021-05-23 DIAGNOSIS — O99212 Obesity complicating pregnancy, second trimester: Secondary | ICD-10-CM

## 2021-05-23 DIAGNOSIS — O219 Vomiting of pregnancy, unspecified: Secondary | ICD-10-CM

## 2021-05-23 DIAGNOSIS — Z1379 Encounter for other screening for genetic and chromosomal anomalies: Secondary | ICD-10-CM

## 2021-05-23 DIAGNOSIS — Z3A20 20 weeks gestation of pregnancy: Secondary | ICD-10-CM

## 2021-05-23 DIAGNOSIS — O169 Unspecified maternal hypertension, unspecified trimester: Secondary | ICD-10-CM

## 2021-05-23 DIAGNOSIS — O34219 Maternal care for unspecified type scar from previous cesarean delivery: Secondary | ICD-10-CM

## 2021-05-23 DIAGNOSIS — O99210 Obesity complicating pregnancy, unspecified trimester: Secondary | ICD-10-CM | POA: Insufficient documentation

## 2021-05-23 DIAGNOSIS — O09299 Supervision of pregnancy with other poor reproductive or obstetric history, unspecified trimester: Secondary | ICD-10-CM

## 2021-05-23 DIAGNOSIS — Z3A21 21 weeks gestation of pregnancy: Secondary | ICD-10-CM

## 2021-05-23 LAB — POCT URINALYSIS DIPSTICK OB: Glucose, UA: NEGATIVE

## 2021-05-23 MED ORDER — DOXYLAMINE-PYRIDOXINE 10-10 MG PO TBEC: 2.0000 | DELAYED_RELEASE_TABLET | Freq: Every day | ORAL | 5 refills | Status: DC

## 2021-05-23 NOTE — Progress Notes (Signed)
New Obstetric Patient H&P   Chief Complaint: "Desires prenatal care"   History of Present Illness: Patient is a 35 y.o. G1P0 Not Hispanic or Latino female, approximate LMP 01/02/2021 presents with amenorrhea and positive home pregnancy test. Based on a 19 week ultrasound (BPD only), her EDD is Estimated Date of Delivery: 10/01/21 and her EGA is [redacted]w[redacted]d. Her last pap smear was 3 years ago and was no abnormalities, HPV negative   Since her LMP she claims she has experienced lower abdominal pressure and morning sickness (which is still occasionally present). She denies vaginal bleeding. Her past medical history is noncontributory. Her prior pregnancies are notable for pre-eclampsia and cesarean delivery  Since her LMP, she admits to the use of tobacco products  yes, but she quite She claims she has gained 23 pounds since the start of her pregnancy.  There are cats in the home in the home  no  She admits close contact with children on a regular basis  yes  She has had chicken pox in the past unknown She has had Tuberculosis exposures, symptoms, or previously tested positive for TB   no Current or past history of domestic violence. no  Genetic Screening/Teratology Counseling: (Includes patient, baby's father, or anyone in either family with:)   1. Patient's age >/= 64 at [facility]  no 2. Thalassemia ([place], [place], Mediterranean, or Asian background): MCV<80  no 3. Neural tube defect (meningomyelocele, spina bifida, anencephaly)  no 4. Congenital heart defect  no  5. Down syndrome  not applicable 6. Tay-Sachs (Jewish, [place]  no 7. Canavan's Disease  no 8. Sickle cell disease or trait (African)  FOB with sickle cell trait  9. Hemophilia or other blood disorders  no  10. Muscular dystrophy  no  11. Cystic fibrosis  no  12. Huntington's Chorea  no  13. Mental retardation/autism  no 14. Other inherited genetic or chromosomal disorder  no 15. Maternal metabolic disorder (DM, PKU, etc)   no 16. Patient or FOB with a child with a birth defect not listed above no  16a. Patient or FOB with a birth defect themselves no 17. Recurrent pregnancy loss, or stillbirth  no  18. Any medications since LMP other than prenatal vitamins (include vitamins, supplements, OTC meds, drugs, alcohol)  PNV 19. Any other genetic/environmental exposure to discuss  no  Infection History:   1. Lives with someone with TB or TB exposed  no  2. Patient or partner has history of genital herpes  no 3. Rash or viral illness since LMP  no 4. History of STI (GC, CT, HPV, syphilis, HIV)  no 5. History of recent travel :  no  Other pertinent information:  no     Review of Systems:10 point review of systems negative unless otherwise noted in HPI  Past Medical History:  Diagnosis Date   No known health problems     Past Surgical History:  Procedure Laterality Date   CESAREAN SECTION     WRIST FRACTURE SURGERY      Gynecologic History: Patient's last menstrual period was 01/02/2021 (exact date).  Obstetric History: G1P0  Family History  Problem Relation Age of Onset   Hypertension Mother    Diabetes Father    Hypertension Father    Hypertension Sister    Hypertension Maternal Grandfather    Ovarian cancer Paternal Grandmother     Social History   Socioeconomic History   Marital status: Single    Spouse name: Not on file  Number of children: Not on file   Years of education: Not on file   Highest education level: Not on file  Occupational History   Not on file  Tobacco Use   Smoking status: Former    Types: Cigarettes   Smokeless tobacco: Never  Vaping Use   Vaping Use: Never used  Substance and Sexual Activity   Alcohol use: Yes    Comment: a little bit   Drug use: No   Sexual activity: Yes    Birth control/protection: None  Other Topics Concern   Not on file  Social History Narrative   Not on file   Social Determinants of Health   Financial Resource Strain: Not on  file  Food Insecurity: Not on file  Transportation Needs: Not on file  Physical Activity: Not on file  Stress: Not on file  Social Connections: Not on file  Intimate Partner Violence: Not on file    No Known Allergies  Prior to Admission medications   Denies    Physical Exam BP (!) 149/94   Ht 5\' 5"  (1.651 m)   Wt 255 lb 9.6 oz (115.9 kg)   LMP 01/02/2021 (Exact Date) Comment: pt approx [redacted] weeks pregnant  BMI 42.53 kg/m   Physical Exam Constitutional:      General: She is not in acute distress.    Appearance: Normal appearance. She is well-developed.  HENT:     Head: Normocephalic and atraumatic.  Eyes:     General: No scleral icterus.    Conjunctiva/sclera: Conjunctivae normal.  Cardiovascular:     Rate and Rhythm: Normal rate and regular rhythm.     Heart sounds: No murmur heard.   No friction rub. No gallop.  Pulmonary:     Effort: Pulmonary effort is normal. No respiratory distress.     Breath sounds: Normal breath sounds. No wheezing or rales.  Abdominal:     General: Bowel sounds are normal. There is no distension.     Palpations: Abdomen is soft. There is no mass.     Tenderness: There is no abdominal tenderness. There is no guarding or rebound.  Musculoskeletal:        General: Normal range of motion.     Cervical back: Normal range of motion and neck supple.  Neurological:     General: No focal deficit present.     Mental Status: She is alert and oriented to person, place, and time.     Cranial Nerves: No cranial nerve deficit.  Skin:    General: Skin is warm and dry.     Findings: No erythema.  Psychiatric:        Mood and Affect: Mood normal.        Behavior: Behavior normal.        Judgment: Judgment normal.    Female Chaperone present during breast and/or pelvic exam.   Assessment: 35 y.o. G1P0 at [redacted]w[redacted]d presenting to initiate prenatal care  Plan: 1) Avoid alcoholic beverages. 2) Patient encouraged not to smoke.  3) Discontinue the use of  all non-medicinal drugs and chemicals.  4) Take prenatal vitamins daily.  5) Nutrition, food safety (fish, cheese advisories, and high nitrite foods) and exercise discussed. 6) Hospital and practice style discussed with cross coverage system.  7) Genetic Screening, such as with 1st Trimester Screening, cell free fetal DNA, AFP testing, and Ultrasound, as well as with amniocentesis and CVS as appropriate, is discussed with patient. At the conclusion of today's visit patient requested genetic  testing 8) Patient is asked about travel to areas at risk for the Zika virus, and counseled to avoid travel and exposure to mosquitoes or sexual partners who may have themselves been exposed to the virus. Testing is discussed, and will be ordered as appropriate.   Thomasene Mohair, MD 05/23/2021 8:56 AM

## 2021-05-24 LAB — PROTEIN / CREATININE RATIO, URINE
Creatinine, Urine: 95.8 mg/dL
Protein, Ur: 14 mg/dL
Protein/Creat Ratio: 146 mg/g creat (ref 0–200)

## 2021-05-25 LAB — URINE CULTURE

## 2021-05-27 LAB — RPR+RH+ABO+RUB AB+AB SCR+CB...
Antibody Screen: NEGATIVE
HIV Screen 4th Generation wRfx: NONREACTIVE
Hematocrit: 36.6 % (ref 34.0–46.6)
Hemoglobin: 12 g/dL (ref 11.1–15.9)
Hepatitis B Surface Ag: NEGATIVE
MCH: 28.1 pg (ref 26.6–33.0)
MCHC: 32.8 g/dL (ref 31.5–35.7)
MCV: 86 fL (ref 79–97)
Platelets: 178 10*3/uL (ref 150–450)
RBC: 4.27 x10E6/uL (ref 3.77–5.28)
RDW: 13.4 % (ref 11.7–15.4)
RPR Ser Ql: NONREACTIVE
Rh Factor: POSITIVE
Rubella Antibodies, IGG: 3 index (ref >0.99)
Varicella zoster IgG: 244 index (ref >165)
WBC: 15 10*3/uL — ABNORMAL HIGH (ref 3.4–10.8)

## 2021-05-27 LAB — MATERNIT 21 PLUS CORE, BLOOD
Fetal Fraction: 10
Result (T21): NEGATIVE
Trisomy 13 (Patau syndrome): NEGATIVE
Trisomy 18 (Edwards syndrome): NEGATIVE
Trisomy 21 (Down syndrome): NEGATIVE

## 2021-05-27 LAB — COMPREHENSIVE METABOLIC PANEL
ALT: 12 IU/L (ref 0–32)
AST: 13 IU/L (ref 0–40)
Albumin/Globulin Ratio: 1.3 (ref 1.2–2.2)
Albumin: 3.6 g/dL — ABNORMAL LOW (ref 3.8–4.8)
Alkaline Phosphatase: 47 IU/L (ref 44–121)
BUN/Creatinine Ratio: 14 (ref 9–23)
BUN: 8 mg/dL (ref 6–20)
Bilirubin Total: 0.3 mg/dL (ref 0.0–1.2)
CO2: 20 mmol/L (ref 20–29)
Calcium: 9.2 mg/dL (ref 8.7–10.2)
Chloride: 104 mmol/L (ref 96–106)
Creatinine, Ser: 0.56 mg/dL — ABNORMAL LOW (ref 0.57–1.00)
Globulin, Total: 2.7 g/dL (ref 1.5–4.5)
Glucose: 86 mg/dL (ref 65–99)
Potassium: 4.3 mmol/L (ref 3.5–5.2)
Sodium: 138 mmol/L (ref 134–144)
Total Protein: 6.3 g/dL (ref 6.0–8.5)
eGFR: 123 mL/min/{1.73_m2} (ref >59)

## 2021-05-27 LAB — HGB FRACTIONATION CASCADE
Hgb A2: 2.8 % (ref 1.8–3.2)
Hgb A: 96.2 % — ABNORMAL LOW (ref 96.4–98.8)
Hgb F: 1 % (ref 0.0–2.0)
Hgb S: 0 %

## 2021-05-27 LAB — HEPATITIS C ANTIBODY: Hep C Virus Ab: <0.1 s/co ratio (ref 0.0–0.9)

## 2021-05-28 ENCOUNTER — Other Ambulatory Visit: Payer: Self-pay | Admitting: Obstetrics and Gynecology

## 2021-05-28 DIAGNOSIS — D582 Other hemoglobinopathies: Secondary | ICD-10-CM

## 2021-05-31 ENCOUNTER — Other Ambulatory Visit: Payer: Self-pay | Admitting: Obstetrics and Gynecology

## 2021-05-31 DIAGNOSIS — O0990 Supervision of high risk pregnancy, unspecified, unspecified trimester: Secondary | ICD-10-CM

## 2021-05-31 DIAGNOSIS — Z3A21 21 weeks gestation of pregnancy: Secondary | ICD-10-CM

## 2021-05-31 DIAGNOSIS — O169 Unspecified maternal hypertension, unspecified trimester: Secondary | ICD-10-CM

## 2021-05-31 DIAGNOSIS — O99212 Obesity complicating pregnancy, second trimester: Secondary | ICD-10-CM

## 2021-05-31 DIAGNOSIS — O09299 Supervision of pregnancy with other poor reproductive or obstetric history, unspecified trimester: Secondary | ICD-10-CM

## 2021-06-06 ENCOUNTER — Encounter: Payer: Self-pay | Admitting: Advanced Practice Midwife

## 2021-06-06 ENCOUNTER — Other Ambulatory Visit: Payer: Self-pay

## 2021-06-06 ENCOUNTER — Ambulatory Visit (INDEPENDENT_AMBULATORY_CARE_PROVIDER_SITE_OTHER): Payer: Medicaid Other | Admitting: Advanced Practice Midwife

## 2021-06-06 VITALS — BP 134/90 | Wt 256.0 lb

## 2021-06-06 DIAGNOSIS — O99212 Obesity complicating pregnancy, second trimester: Secondary | ICD-10-CM

## 2021-06-06 DIAGNOSIS — Z3A23 23 weeks gestation of pregnancy: Secondary | ICD-10-CM

## 2021-06-06 DIAGNOSIS — O169 Unspecified maternal hypertension, unspecified trimester: Secondary | ICD-10-CM

## 2021-06-06 DIAGNOSIS — O09299 Supervision of pregnancy with other poor reproductive or obstetric history, unspecified trimester: Secondary | ICD-10-CM

## 2021-06-06 DIAGNOSIS — O0992 Supervision of high risk pregnancy, unspecified, second trimester: Secondary | ICD-10-CM

## 2021-06-06 LAB — POCT URINALYSIS DIPSTICK OB
Glucose, UA: NEGATIVE
POC,PROTEIN,UA: NEGATIVE

## 2021-06-06 NOTE — Progress Notes (Signed)
  Routine Prenatal Care Visit  Subjective  Crystal Choi is a 35 y.o. G2P1001 at [redacted]w[redacted]d being seen today for ongoing prenatal care.  She is currently monitored for the following issues for this high-risk pregnancy and has Supervision of high risk pregnancy, antepartum; Obesity affecting pregnancy; History of cesarean section complicating pregnancy; Hx of pre-eclampsia in prior pregnancy, currently pregnant; Elevated blood pressure affecting pregnancy, antepartum; Nausea and vomiting during pregnancy; and History of abnormal cervical Pap smear on their problem list.  ----------------------------------------------------------------------------------- Patient reports no complaints.  She denies headache, visual changes, RUQ pain. We discussed waiting to start anti-hypertensive if needed based on blood pressure reading today. She will get a home BP cuff. She has anatomy scan scheduled with MFM in [place] in 2 weeks.  . Vag. Bleeding: None.  Movement: Present. Leaking Fluid denies.  ----------------------------------------------------------------------------------- The following portions of the patient's history were reviewed and updated as appropriate: allergies, current medications, past family history, past medical history, past social history, past surgical history and problem list. Problem list updated.  Objective  Blood pressure 134/90, weight 256 lb (116.1 kg), last menstrual period 01/02/2021. Pregravid weight 236 lb (107 kg) Total Weight Gain 20 lb (9.072 kg) Urinalysis: Urine Protein Negative  Urine Glucose Negative  Fetal Status: Fetal Heart Rate (bpm): 148 Fundal Height: 24 cm Movement: Present     General:  Alert, oriented and cooperative. Patient is in no acute distress.  Skin: Skin is warm and dry. No rash noted.   Cardiovascular: Normal heart rate noted  Respiratory: Normal respiratory effort, no problems with respiration noted  Abdomen: Soft, gravid, appropriate for  gestational age.       Pelvic:  Cervical exam deferred        Extremities: Normal range of motion.     Mental Status: Normal mood and affect. Normal behavior. Normal judgment and thought content.   Assessment   35 y.o. G2P1001 at [redacted]w[redacted]d by  10/01/2021, by Ultrasound presenting for routine prenatal visit  Plan   pregnancy 3 Problems (from 05/23/21 to present)    Problem Noted Resolved   Supervision of high risk pregnancy, antepartum 05/23/2021 by Conard Novak, MD No   Obesity affecting pregnancy 05/23/2021 by Conard Novak, MD No   History of cesarean section complicating pregnancy 05/23/2021 by Conard Novak, MD No   Hx of pre-eclampsia in prior pregnancy, currently pregnant 05/23/2021 by Conard Novak, MD No   Elevated blood pressure affecting pregnancy, antepartum 05/23/2021 by Conard Novak, MD No   Nausea and vomiting during pregnancy 05/23/2021 by Conard Novak, MD No       Preterm labor symptoms and general obstetric precautions including but not limited to vaginal bleeding, contractions, leaking of fluid and fetal movement were reviewed in detail with the patient. Please refer to After Visit Summary for other counseling recommendations.   Return for scheduled follow up.  Tresea Mall, CNM 06/06/2021 8:38 AM

## 2021-06-06 NOTE — Progress Notes (Signed)
ROB - no concerns. RM 2 

## 2021-06-06 NOTE — Patient Instructions (Signed)
Hypertension During Pregnancy High blood pressure (hypertension) is when the force of blood pumping through the arteries is high enough to cause problems with your health. Arteries are blood vessels that carry blood from the heart throughout the body. Hypertension during pregnancy can cause problems for you and your baby. It can be mild or severe. There are different types of hypertension that can happen during pregnancy. These include: Chronic hypertension. This happens when you had high blood pressure before you became pregnant, and it continues during the pregnancy. Hypertension that develops before you are [redacted] weeks pregnant and continues during the pregnancy is also called chronic hypertension. If you have chronic hypertension, it will not go away after you have your baby. You will need follow-up visits with your health care provider after you have your baby. Your health care provider may want you to keep taking medicine for your blood pressure. Gestational hypertension. This is hypertension that develops after the 20th week of pregnancy. Gestational hypertension usually goes away after you have your baby, but your health care provider will need to monitor your blood pressure to make sure that it is getting better. Postpartum hypertension. This is high blood pressure that was present before delivery and continues after delivery or that starts after delivery. This usually occurs within 48 hours after childbirth but may occur up to 6 weeks after giving birth. When hypertension during pregnancy is severe, it is a medical emergency that requires treatment right away. How does this affect me? Women who have hypertension during pregnancy have a greater chance of developing hypertension later in life or during future pregnancies. In some cases, hypertension during pregnancy can cause serious complications, such as: Stroke. Heart attack. Injury to other organs, such as kidneys, lungs, or  liver. Preeclampsia. A condition called hemolysis, elevated liver enzymes, and low platelet count (HELLP) syndrome. Convulsions or seizures. Placental abruption. How does this affect my baby? Hypertension during pregnancy can affect your baby. Your baby may: Be born early (prematurely). Not weigh as much as he or she should at birth (low birth weight). Not tolerate labor well, leading to an unplanned cesarean delivery. This condition may also result in a baby's death before birth (stillbirth). What are the risks? There are certain factors that make it more likely for you to develop hypertension during pregnancy. These include: Having hypertension during a previous pregnancy or a family history of hypertension. Being overweight. Being age 34 or older. Being pregnant for the first time. Being pregnant with more than one baby. Becoming pregnant using fertilization methods, such as IVF (in vitro fertilization). Having other medical problems, such as diabetes, kidney disease, or lupus. What can I do to lower my risk? The exact cause of hypertension during pregnancy is not known. You may be able to lower your risk by: Maintaining a healthy weight. Eating a healthy and balanced diet. Following your health care provider's instructions about treating any long-term conditions that you had before becoming pregnant. It is very important to keep all of your prenatal care appointments. Your health care provider will check your blood pressure and make sure that your pregnancy is progressing as expected. If a problem is found, early treatment can prevent complications. How is this treated? Treatment for hypertension during pregnancy varies depending on the type of hypertension you have and how serious it is. If you were taking medicine for high blood pressure before you became pregnant, talk with your health care provider. You may need to change medicine during pregnancy because some   medicines, like ACE  inhibitors, may not be considered safe for your baby. If you have gestational hypertension, your health care provider may order medicine to treat this during pregnancy. If you are at risk for preeclampsia, your health care provider may recommend that you take a low-dose aspirin during your pregnancy. If you have severe hypertension, you may need to be hospitalized so you and your baby can be monitored closely. You may also need to be given medicine to lower your blood pressure. In some cases, if your condition gets worse, you may need to deliver your baby early. Follow these instructions at home: Eating and drinking  Drink enough fluid to keep your urine pale yellow. Avoid caffeine. Lifestyle Do not use any products that contain nicotine or tobacco. These products include cigarettes, chewing tobacco, and vaping devices, such as e-cigarettes. If you need help quitting, ask your health care provider. Do not use alcohol or drugs. Avoid stress as much as possible. Rest and get plenty of sleep. Regular exercise can help to reduce your blood pressure. Ask your health care provider what kinds of exercise are best for you. General instructions Take over-the-counter and prescription medicines only as told by your health care provider. Keep all prenatal and follow-up visits. This is important. Contact a health care provider if: You have symptoms that your health care provider told you may require more treatment or monitoring, such as: Headaches. Nausea or vomiting. Abdominal pain. Dizziness. Light-headedness. Get help right away if: You have symptoms of serious complications, such as: Severe abdominal pain that does not get better with treatment. A severe headache that does not get better, blurred vision, or double vision. Vomiting that does not get better. Sudden, rapid weight gain or swelling in your hands, ankles, or face. Vaginal bleeding. Blood in your urine. Shortness of breath or chest  pain. Weakness on one side of your body or difficulty speaking. Your baby is not moving as much as usual. These symptoms may represent a serious problem that is an emergency. Do not wait to see if the symptoms will go away. Get medical help right away. Call your local emergency services (911 in the [place] Do not drive yourself to the hospital. Summary Hypertension during pregnancy can cause problems for you and your baby. Treatment for hypertension during pregnancy varies depending on the type of hypertension you have and how serious it is. Keep all prenatal and follow-up visits. This is important. Get help right away if you have symptoms of serious complications related to high blood pressure. This information is not intended to replace advice given to you by your health care provider. Make sure you discuss any questions you have with your health care provider. Document Revised: 07/13/2020 Document Reviewed: 07/13/2020 Elsevier Patient Education  2022 Elsevier Inc.  

## 2021-06-07 ENCOUNTER — Inpatient Hospital Stay: Payer: Medicaid Other

## 2021-06-07 ENCOUNTER — Encounter: Payer: Self-pay | Admitting: Internal Medicine

## 2021-06-07 ENCOUNTER — Other Ambulatory Visit: Payer: Self-pay

## 2021-06-07 ENCOUNTER — Inpatient Hospital Stay: Payer: Medicaid Other | Attending: Internal Medicine | Admitting: Internal Medicine

## 2021-06-07 DIAGNOSIS — D582 Other hemoglobinopathies: Secondary | ICD-10-CM

## 2021-06-07 NOTE — Progress Notes (Signed)
[facility] CONSULT NOTE  Patient Care Team: Rolm Gala, MD as PCP - General (Family Medicine)  CHIEF COMPLAINTS/PURPOSE OF CONSULTATION: Possible hemoglobinopathy.  HEMATOLOGY HISTORY:  Hgb F 0.0 - 2.0 % 1.0   Hgb A 96.4 - 98.8 % 96.2 Low    Hgb A2 1.8 - 3.2 % 2.8   Hgb S 0.0 % 0.0   Interpretation, Hgb Fract  Comment   Comment: Normal hemoglobin present; no hemoglobin variant or beta thalassemia  identified.  Note: Alpha thalassemia may not be detected by the Hgb Fractionation  Cascade panel. If alpha thalassemia is suspected, [facility] offers  Alpha-Thalassemia DNA Analysis [phone]).      HISTORY OF PRESENTING ILLNESS:  Crystal Choi 35 y.o.  female has been referred to [place] for further evaluation for possible hemoglobinopathy.  Patient was recently evaluated by obstetrics.  Patient is currently pregnant.  Patient denies any fatigue.  Denies any blood in stools or black or stools.  Denies any history of blood transfusions.  Review of Systems  Constitutional:  Negative for chills, diaphoresis, fever, malaise/fatigue and weight loss.  HENT:  Negative for nosebleeds and sore throat.   Eyes:  Negative for double vision.  Respiratory:  Negative for cough, hemoptysis, sputum production, shortness of breath and wheezing.   Cardiovascular:  Negative for chest pain, palpitations, orthopnea and leg swelling.  Gastrointestinal:  Negative for abdominal pain, blood in stool, constipation, diarrhea, heartburn, melena, nausea and vomiting.  Genitourinary:  Negative for dysuria, frequency and urgency.  Musculoskeletal:  Negative for back pain and joint pain.  Skin: Negative.  Negative for itching and rash.  Neurological:  Negative for dizziness, tingling, focal weakness, weakness and headaches.  Endo/Heme/Allergies:  Does not bruise/bleed easily.  Psychiatric/Behavioral:  Negative for depression. The patient is not nervous/anxious and does not have insomnia.     MEDICAL HISTORY:  Past Medical History:  Diagnosis Date   No known health problems     SURGICAL HISTORY: Past Surgical History:  Procedure Laterality Date   CESAREAN SECTION     WRIST FRACTURE SURGERY      SOCIAL HISTORY: Social History   Socioeconomic History   Marital status: Single    Spouse name: Not on file   Number of children: Not on file   Years of education: Not on file   Highest education level: Not on file  Occupational History   Not on file  Tobacco Use   Smoking status: Former    Types: Cigarettes   Smokeless tobacco: Never  Vaping Use   Vaping Use: Never used  Substance and Sexual Activity   Alcohol use: Yes    Comment: a little bit   Drug use: No   Sexual activity: Yes    Birth control/protection: None  Other Topics Concern   Not on file  Social History Narrative   Not on file   Social Determinants of Health   Financial Resource Strain: Not on file  Food Insecurity: Not on file  Transportation Needs: Not on file  Physical Activity: Not on file  Stress: Not on file  Social Connections: Not on file  Intimate Partner Violence: Not on file    FAMILY HISTORY: Family History  Problem Relation Age of Onset   Hypertension Mother    Diabetes Father    Hypertension Father    Hypertension Sister    Hypertension Maternal Grandfather    Ovarian cancer Paternal Grandmother     ALLERGIES:  has No Known Allergies.  MEDICATIONS:  Current Outpatient Medications  Medication Sig Dispense Refill   Doxylamine-Pyridoxine (DICLEGIS) 10-10 MG TBEC Take 2 tablets by mouth at bedtime. If symptoms persist, add one tablet in the morning and one in the afternoon 120 tablet 5   No current facility-administered medications for this visit.      PHYSICAL EXAMINATION:   Vitals:   06/07/21 1056  BP: (!) 139/97  Pulse: (!) 122  Resp: 20  Temp: 98 F (36.7 C)   Filed Weights   06/07/21 1056  Weight: 256 lb (116.1 kg)    Physical Exam Vitals and  nursing note reviewed.  Constitutional:      Comments: Alone.    HENT:     Head: Normocephalic and atraumatic.     Mouth/Throat:     Pharynx: Oropharynx is clear.  Eyes:     Extraocular Movements: Extraocular movements intact.     Pupils: Pupils are equal, round, and reactive to light.  Cardiovascular:     Rate and Rhythm: Normal rate and regular rhythm.  Pulmonary:     Effort: Pulmonary effort is normal.     Breath sounds: Normal breath sounds.  Abdominal:     Palpations: Abdomen is soft.     Comments: Gravid uterus.  Musculoskeletal:        General: Normal range of motion.     Cervical back: Normal range of motion.  Skin:    General: Skin is warm.  Neurological:     General: No focal deficit present.     Mental Status: She is alert and oriented to person, place, and time.  Psychiatric:        Behavior: Behavior normal.        Judgment: Judgment normal.    LABORATORY DATA:  I have reviewed the data as listed Lab Results  Component Value Date   WBC 15.0 (H) 05/23/2021   HGB 12.0 05/23/2021   HCT 36.6 05/23/2021   MCV 86 05/23/2021   PLT 178 05/23/2021   Recent Labs    05/09/21 2227 05/23/21 0944  NA 134* 138  K 3.8 4.3  CL 103 104  CO2 22 20  GLUCOSE 112* 86  BUN 7 8  CREATININE 0.60 0.56*  CALCIUM 9.0 9.2  GFRNONAA >60  --   PROT 6.6 6.3  ALBUMIN 3.2* 3.6*  AST 14* 13  ALT 14 12  ALKPHOS 36* 47  BILITOT 0.5 0.3     No results found.  Hemoglobinopathy (HCC) #Patient's hemoglobin is 12; MCV normal. July 2022- Hemoglobin electrophoresis-normal limits.  No evidence of hemoglobinopathy noted based on current blood work available.   Thank you, Dr.Schuman for allowing me to participate in the care of your pleasant patient. Please do not hesitate to contact me with questions or concerns in the interim.  #Follow-up only as needed.  All questions were answered. The patient knows to call the clinic with any problems, questions or concerns.    Earna Coder, MD 06/10/2021 12:07 AM

## 2021-06-07 NOTE — Assessment & Plan Note (Addendum)
#  Patient's hemoglobin is 12; MCV normal. July 2022- Hemoglobin electrophoresis-normal limits.  No evidence of hemoglobinopathy noted based on current blood work available.   Thank you, Dr.Schuman for allowing me to participate in the care of your pleasant patient. Please do not hesitate to contact me with questions or concerns in the interim.  #Follow-up only as needed.

## 2021-06-25 ENCOUNTER — Other Ambulatory Visit: Payer: Self-pay

## 2021-06-25 ENCOUNTER — Other Ambulatory Visit: Payer: Self-pay | Admitting: *Deleted

## 2021-06-25 ENCOUNTER — Ambulatory Visit: Payer: Medicaid Other | Attending: Obstetrics and Gynecology

## 2021-06-25 ENCOUNTER — Encounter: Payer: Self-pay | Admitting: *Deleted

## 2021-06-25 ENCOUNTER — Ambulatory Visit: Payer: Medicaid Other | Admitting: *Deleted

## 2021-06-25 VITALS — BP 135/95 | HR 101

## 2021-06-25 DIAGNOSIS — O0990 Supervision of high risk pregnancy, unspecified, unspecified trimester: Secondary | ICD-10-CM

## 2021-06-25 DIAGNOSIS — O169 Unspecified maternal hypertension, unspecified trimester: Secondary | ICD-10-CM

## 2021-06-25 DIAGNOSIS — O219 Vomiting of pregnancy, unspecified: Secondary | ICD-10-CM | POA: Diagnosis present

## 2021-06-25 DIAGNOSIS — O34219 Maternal care for unspecified type scar from previous cesarean delivery: Secondary | ICD-10-CM | POA: Diagnosis not present

## 2021-06-25 DIAGNOSIS — O10012 Pre-existing essential hypertension complicating pregnancy, second trimester: Secondary | ICD-10-CM | POA: Diagnosis not present

## 2021-06-25 DIAGNOSIS — O99212 Obesity complicating pregnancy, second trimester: Secondary | ICD-10-CM

## 2021-06-25 DIAGNOSIS — O10919 Unspecified pre-existing hypertension complicating pregnancy, unspecified trimester: Secondary | ICD-10-CM

## 2021-06-25 DIAGNOSIS — Z3A24 24 weeks gestation of pregnancy: Secondary | ICD-10-CM | POA: Diagnosis not present

## 2021-06-25 DIAGNOSIS — O09299 Supervision of pregnancy with other poor reproductive or obstetric history, unspecified trimester: Secondary | ICD-10-CM | POA: Insufficient documentation

## 2021-06-25 DIAGNOSIS — Z3A21 21 weeks gestation of pregnancy: Secondary | ICD-10-CM | POA: Diagnosis present

## 2021-06-26 ENCOUNTER — Encounter: Payer: Self-pay | Admitting: Advanced Practice Midwife

## 2021-06-26 ENCOUNTER — Ambulatory Visit (INDEPENDENT_AMBULATORY_CARE_PROVIDER_SITE_OTHER): Payer: Medicaid Other | Admitting: Advanced Practice Midwife

## 2021-06-26 VITALS — BP 130/92 | HR 102 | Ht 65.0 in | Wt 254.6 lb

## 2021-06-26 DIAGNOSIS — Z369 Encounter for antenatal screening, unspecified: Secondary | ICD-10-CM

## 2021-06-26 DIAGNOSIS — O99212 Obesity complicating pregnancy, second trimester: Secondary | ICD-10-CM

## 2021-06-26 DIAGNOSIS — Z131 Encounter for screening for diabetes mellitus: Secondary | ICD-10-CM

## 2021-06-26 DIAGNOSIS — O0992 Supervision of high risk pregnancy, unspecified, second trimester: Secondary | ICD-10-CM

## 2021-06-26 DIAGNOSIS — O09299 Supervision of pregnancy with other poor reproductive or obstetric history, unspecified trimester: Secondary | ICD-10-CM

## 2021-06-26 DIAGNOSIS — Z3A26 26 weeks gestation of pregnancy: Secondary | ICD-10-CM

## 2021-06-26 DIAGNOSIS — Z113 Encounter for screening for infections with a predominantly sexual mode of transmission: Secondary | ICD-10-CM

## 2021-06-26 DIAGNOSIS — O169 Unspecified maternal hypertension, unspecified trimester: Secondary | ICD-10-CM

## 2021-06-26 DIAGNOSIS — Z130 Encounter for screening for diseases of the blood and blood-forming organs and certain disorders involving the immune mechanism: Secondary | ICD-10-CM

## 2021-06-26 LAB — POCT URINALYSIS DIPSTICK OB
Glucose, UA: NEGATIVE
POC,PROTEIN,UA: NEGATIVE

## 2021-06-26 MED ORDER — LABETALOL HCL 200 MG PO TABS: 200.0000 mg | ORAL_TABLET | Freq: Two times a day (BID) | ORAL | 2 refills | Status: DC

## 2021-06-26 NOTE — Progress Notes (Signed)
  Routine Prenatal Care Visit  Subjective  Crystal Choi is a 35 y.o. G3P1011 at [redacted]w[redacted]d being seen today for ongoing prenatal care.  She is currently monitored for the following issues for this high-risk pregnancy and has Supervision of high risk pregnancy, antepartum; Obesity affecting pregnancy; History of cesarean section complicating pregnancy; Hx of pre-eclampsia in prior pregnancy, currently pregnant; Elevated blood pressure affecting pregnancy, antepartum; Nausea and vomiting during pregnancy; History of abnormal cervical Pap smear; and Hemoglobinopathy (HCC) on their problem list.  ----------------------------------------------------------------------------------- Patient reports she was seen by MFM for anatomy scan yesterday where her blood pressure was mildly elevated. They suggested that we may want to start an anti-hypertensive. She denies any headache, visual changes or epigastric pain. She is still taking her baby ASA. Today we will do PIH labs and start her on Labetalol 200 mg BID.     Contractions: Not present. Vag. Bleeding: None.  Movement: Present. Leaking Fluid denies.  ----------------------------------------------------------------------------------- The following portions of the patient's history were reviewed and updated as appropriate: allergies, current medications, past family history, past medical history, past social history, past surgical history and problem list. Problem list updated.  Objective  Blood pressure (!) 130/92, pulse (!) 102, height 5\' 5"  (1.651 m), weight 254 lb 9.6 oz (115.5 kg), last menstrual period 03/01/Choi. Pregravid weight 236 lb (107 kg) Total Weight Gain 18 lb 9.6 oz (8.437 kg) Urinalysis: Urine Protein    Urine Glucose    Fetal Status: Fetal Heart Rate (bpm): 140 Fundal Height: 26 cm Movement: Present     General:  Alert, oriented and cooperative. Patient is in no acute distress.  Skin: Skin is warm and dry. No rash noted.    Cardiovascular: Normal heart rate noted  Respiratory: Normal respiratory effort, no problems with respiration noted  Abdomen: Soft, gravid, appropriate for gestational age. Pain/Pressure: Absent     Pelvic:  Cervical exam deferred        Extremities: Normal range of motion.     Mental Status: Normal mood and affect. Normal behavior. Normal judgment and thought content.   Assessment   Crystal y.o. G3P1011 at [redacted]w[redacted]d by  Crystal Choi, by Ultrasound presenting for routine prenatal visit  Plan   Crystal Choi Problems (from 05/23/21 to present)    Problem Noted Resolved   Supervision of high risk pregnancy, antepartum 7/20/Choi by 05/25/2021, MD No   Obesity affecting pregnancy 7/20/Choi by 05/25/2021, MD No   History of cesarean section complicating pregnancy 7/20/Choi by 05/25/2021, MD No   Hx of pre-eclampsia in prior pregnancy, currently pregnant 7/20/Choi by 05/25/2021, MD No   Elevated blood pressure affecting pregnancy, antepartum 7/20/Choi by 05/25/2021, MD No   Nausea and vomiting during pregnancy 7/20/Choi by 05/25/2021, MD No     Elevated BP:  CBC, CMP, Protein/Creatinine ratio Rx Labetalol 200 mg BID Check BP at home   Preterm labor symptoms and general obstetric precautions including but not limited to vaginal bleeding, contractions, leaking of fluid and fetal movement were reviewed in detail with the patient.   Return in about 2 weeks (around 9/6/Choi) for 28 wk labs and rob.  Crystal Choi, CNM Crystal Choi 10:26 AM

## 2021-06-27 LAB — COMPREHENSIVE METABOLIC PANEL
ALT: 12 IU/L (ref 0–32)
AST: 10 IU/L (ref 0–40)
Albumin/Globulin Ratio: 1.4 (ref 1.2–2.2)
Albumin: 3.8 g/dL (ref 3.8–4.8)
Alkaline Phosphatase: 58 IU/L (ref 44–121)
BUN/Creatinine Ratio: 9 (ref 9–23)
BUN: 5 mg/dL — ABNORMAL LOW (ref 6–20)
Bilirubin Total: 0.4 mg/dL (ref 0.0–1.2)
CO2: 18 mmol/L — ABNORMAL LOW (ref 20–29)
Calcium: 9.1 mg/dL (ref 8.7–10.2)
Chloride: 104 mmol/L (ref 96–106)
Creatinine, Ser: 0.55 mg/dL — ABNORMAL LOW (ref 0.57–1.00)
Globulin, Total: 2.7 g/dL (ref 1.5–4.5)
Glucose: 87 mg/dL (ref 65–99)
Potassium: 3.7 mmol/L (ref 3.5–5.2)
Sodium: 138 mmol/L (ref 134–144)
Total Protein: 6.5 g/dL (ref 6.0–8.5)
eGFR: 123 mL/min/{1.73_m2} (ref >59)

## 2021-06-27 LAB — CBC
Hematocrit: 34.4 % (ref 34.0–46.6)
Hemoglobin: 11.3 g/dL (ref 11.1–15.9)
MCH: 28 pg (ref 26.6–33.0)
MCHC: 32.8 g/dL (ref 31.5–35.7)
MCV: 85 fL (ref 79–97)
Platelets: 181 10*3/uL (ref 150–450)
RBC: 4.04 x10E6/uL (ref 3.77–5.28)
RDW: 13.3 % (ref 11.7–15.4)
WBC: 12 10*3/uL — ABNORMAL HIGH (ref 3.4–10.8)

## 2021-06-27 LAB — PROTEIN / CREATININE RATIO, URINE
Creatinine, Urine: 90.1 mg/dL
Protein, Ur: 21.4 mg/dL
Protein/Creat Ratio: 238 mg/g creat — ABNORMAL HIGH (ref 0–200)

## 2021-07-05 DIAGNOSIS — Z8041 Family history of malignant neoplasm of ovary: Secondary | ICD-10-CM

## 2021-07-05 HISTORY — DX: Family history of malignant neoplasm of ovary: Z80.41

## 2021-07-11 ENCOUNTER — Other Ambulatory Visit (HOSPITAL_COMMUNITY)
Admission: RE | Admit: 2021-07-11 | Discharge: 2021-07-11 | Disposition: A | Discharge status: Home / Self Care | Payer: Medicaid Other | Source: Ambulatory Visit | Attending: Obstetrics and Gynecology | Admitting: Obstetrics and Gynecology

## 2021-07-11 ENCOUNTER — Encounter: Payer: Self-pay | Admitting: Obstetrics and Gynecology

## 2021-07-11 ENCOUNTER — Other Ambulatory Visit: Payer: Medicaid Other

## 2021-07-11 ENCOUNTER — Other Ambulatory Visit: Payer: Self-pay

## 2021-07-11 ENCOUNTER — Ambulatory Visit (INDEPENDENT_AMBULATORY_CARE_PROVIDER_SITE_OTHER): Payer: Medicaid Other | Admitting: Obstetrics and Gynecology

## 2021-07-11 VITALS — BP 132/84 | Wt 259.0 lb

## 2021-07-11 DIAGNOSIS — Z124 Encounter for screening for malignant neoplasm of cervix: Secondary | ICD-10-CM

## 2021-07-11 DIAGNOSIS — Z369 Encounter for antenatal screening, unspecified: Secondary | ICD-10-CM

## 2021-07-11 DIAGNOSIS — Z113 Encounter for screening for infections with a predominantly sexual mode of transmission: Secondary | ICD-10-CM

## 2021-07-11 DIAGNOSIS — O0992 Supervision of high risk pregnancy, unspecified, second trimester: Secondary | ICD-10-CM

## 2021-07-11 DIAGNOSIS — Z3A28 28 weeks gestation of pregnancy: Secondary | ICD-10-CM

## 2021-07-11 DIAGNOSIS — Z130 Encounter for screening for diseases of the blood and blood-forming organs and certain disorders involving the immune mechanism: Secondary | ICD-10-CM

## 2021-07-11 DIAGNOSIS — O0990 Supervision of high risk pregnancy, unspecified, unspecified trimester: Secondary | ICD-10-CM

## 2021-07-11 DIAGNOSIS — O10919 Unspecified pre-existing hypertension complicating pregnancy, unspecified trimester: Secondary | ICD-10-CM

## 2021-07-11 DIAGNOSIS — Z131 Encounter for screening for diabetes mellitus: Secondary | ICD-10-CM

## 2021-07-11 DIAGNOSIS — O99212 Obesity complicating pregnancy, second trimester: Secondary | ICD-10-CM

## 2021-07-11 MED ORDER — LABETALOL HCL 200 MG PO TABS: 400.0000 mg | ORAL_TABLET | Freq: Two times a day (BID) | ORAL | 11 refills | Status: DC

## 2021-07-11 NOTE — Progress Notes (Signed)
No vb. No lof. 28 week labs today.  

## 2021-07-11 NOTE — Patient Instructions (Signed)

## 2021-07-11 NOTE — Progress Notes (Signed)
Routine Prenatal Care Visit  Subjective  Crystal Choi is a 35 y.o. G3P1011 at [redacted]w[redacted]d being seen today for ongoing prenatal care.  She is currently monitored for the following issues for this high-risk pregnancy and has Supervision of high risk pregnancy, antepartum; Obesity affecting pregnancy; History of cesarean section complicating pregnancy; Hx of pre-eclampsia in prior pregnancy, currently pregnant; Elevated blood pressure affecting pregnancy, antepartum; Nausea and vomiting during pregnancy; History of abnormal cervical Pap smear; and Hemoglobinopathy (HCC) on their problem list.  ----------------------------------------------------------------------------------- Patient reports no complaints.   Contractions: Not present. Vag. Bleeding: None.  Movement: Present. Denies leaking of fluid.  ----------------------------------------------------------------------------------- The following portions of the patient's history were reviewed and updated as appropriate: allergies, current medications, past family history, past medical history, past social history, past surgical history and problem list. Problem list updated.   Objective  Blood pressure 132/84, weight 259 lb (117.5 kg), last menstrual period 01/02/2021. Pregravid weight 236 lb (107 kg) Total Weight Gain 23 lb (10.4 kg) Urinalysis:      Fetal Status: Fetal Heart Rate (bpm): 120   Movement: Present     General:  Alert, oriented and cooperative. Patient is in no acute distress.  Skin: Skin is warm and dry. No rash noted.   Cardiovascular: Normal heart rate noted  Respiratory: Normal respiratory effort, no problems with respiration noted  Abdomen: Soft, gravid, appropriate for gestational age. Pain/Pressure: Absent     Pelvic:  Cervical exam deferred        Extremities: Normal range of motion.     Mental Status: Normal mood and affect. Normal behavior. Normal judgment and thought content.     Assessment   35 y.o.  G3P1011 at [redacted]w[redacted]d by  10/01/2021, by Ultrasound presenting for routine prenatal visit  Plan   July 2022 Problems (from 05/23/21 to present)     Problem Noted Resolved   Supervision of high risk pregnancy, antepartum 05/23/2021 by Conard Novak, MD No   Overview Addendum 07/11/2021 11:09 AM by Natale Milch, MD     Nursing Staff Provider  Office Location  [facility] Dating  19 wk [place]  Language  English Anatomy US  normal  Flu Vaccine   Genetic Screen  NIPS: normal xy  TDaP vaccine    Hgb A1C or  GTT Third trimester :   Covid    LAB RESULTS   Rhogam  Not needed Blood Type O/Positive/-- (07/20 0944)   Feeding Plan  Antibody Negative (07/20 0944)  Contraception  Rubella 3.00 (07/20 0944)  Circumcision  RPR Non Reactive (07/20 0944)   Pediatrician   HBsAg Negative (07/20 0944)   Support Person  HIV Non Reactive (07/20 0944)  Prenatal Classes  Varicella  Immune    GBS  (For PCN allergy, check sensitivities)   BTL Consent     VBAC Consent  Pap  02/25/2018- NIL    Hgb Electro  normal    CF      SMA        CHTN History of a cesarean section- desires repeat      Obesity affecting pregnancy 05/23/2021 by Conard Novak, MD No   History of cesarean section complicating pregnancy 05/23/2021 by Conard Novak, MD No   Hx of pre-eclampsia in prior pregnancy, currently pregnant 05/23/2021 by Conard Novak, MD No   Elevated blood pressure affecting pregnancy, antepartum 05/23/2021 by Conard Novak, MD No   Nausea and vomiting during pregnancy 05/23/2021 by Conard Novak,  MD No        Pap smear today Advised to increase labetalol to 400 mg twice a day.  Patient reports she is taking her daily baby aspirin. 28-week labs today. Encouraged to maintain weight. Discussed weight gain goal during pregnancy.   Gestational age appropriate obstetric precautions including but not limited to vaginal bleeding, contractions, leaking of fluid and fetal movement were reviewed  in detail with the patient.    Return in about 2 weeks (around 07/25/2021) for ROB every 2 weeks for 4 visits.  Natale Milch MD [facility], [facility] 07/11/2021, 11:16 AM

## 2021-07-12 ENCOUNTER — Encounter: Payer: Self-pay | Admitting: Obstetrics and Gynecology

## 2021-07-12 ENCOUNTER — Telehealth: Payer: Self-pay

## 2021-07-12 LAB — 28 WEEK RH+PANEL
Basophils Absolute: 0 10*3/uL (ref 0.0–0.2)
Basos: 0 %
EOS (ABSOLUTE): 0.2 10*3/uL (ref 0.0–0.4)
Eos: 2 %
Gestational Diabetes Screen: 107 mg/dL (ref 65–139)
HIV Screen 4th Generation wRfx: NONREACTIVE
Hematocrit: 31.3 % — ABNORMAL LOW (ref 34.0–46.6)
Hemoglobin: 10.5 g/dL — ABNORMAL LOW (ref 11.1–15.9)
Immature Grans (Abs): 0.1 10*3/uL (ref 0.0–0.1)
Immature Granulocytes: 1 %
Lymphocytes Absolute: 1.8 10*3/uL (ref 0.7–3.1)
Lymphs: 16 %
MCH: 28.1 pg (ref 26.6–33.0)
MCHC: 33.5 g/dL (ref 31.5–35.7)
MCV: 84 fL (ref 79–97)
Monocytes Absolute: 0.7 10*3/uL (ref 0.1–0.9)
Monocytes: 7 %
Neutrophils Absolute: 8.5 10*3/uL — ABNORMAL HIGH (ref 1.4–7.0)
Neutrophils: 74 %
Platelets: 189 10*3/uL (ref 150–450)
RBC: 3.74 x10E6/uL — ABNORMAL LOW (ref 3.77–5.28)
RDW: 12.5 % (ref 11.7–15.4)
RPR Ser Ql: NONREACTIVE
WBC: 11.5 10*3/uL — ABNORMAL HIGH (ref 3.4–10.8)

## 2021-07-12 NOTE — Telephone Encounter (Signed)
Pt calling; wants a call back.  [phone]  [facility] or leave a detailed msg on nurse line.

## 2021-07-13 NOTE — Telephone Encounter (Signed)
Spoke with patient regarding question. She was inquiring if her pap came back abnormal. Advised her pap has not resulted yet. Alicia Copland, PAC reviewed her family history cancer questionnaire and determined she may be eligible for genetic testing for breast, ovarian, colon cancer. She would like to discuss in further details. Advised letter states to schedule appointment to learn more about testing. Will have front desk contact patient for scheduling.

## 2021-07-13 NOTE — Telephone Encounter (Signed)
Patient is scheduled for 07/16/21 with ABC

## 2021-07-13 NOTE — Telephone Encounter (Signed)
Patient is calling about the letter she received yesterday in my chart regarding Genetic Testing. Cb#[phone]

## 2021-07-16 ENCOUNTER — Ambulatory Visit: Payer: Medicaid Other | Admitting: Obstetrics and Gynecology

## 2021-07-17 LAB — CYTOLOGY - PAP
Comment: NEGATIVE
Diagnosis: NEGATIVE
High risk HPV: NEGATIVE

## 2021-07-23 ENCOUNTER — Other Ambulatory Visit: Payer: Self-pay

## 2021-07-23 ENCOUNTER — Encounter: Payer: Self-pay | Admitting: *Deleted

## 2021-07-23 ENCOUNTER — Ambulatory Visit: Payer: Medicaid Other | Attending: Obstetrics

## 2021-07-23 ENCOUNTER — Ambulatory Visit: Payer: Medicaid Other | Admitting: *Deleted

## 2021-07-23 VITALS — BP 134/87 | HR 88

## 2021-07-23 DIAGNOSIS — O0990 Supervision of high risk pregnancy, unspecified, unspecified trimester: Secondary | ICD-10-CM

## 2021-07-23 DIAGNOSIS — O99212 Obesity complicating pregnancy, second trimester: Secondary | ICD-10-CM

## 2021-07-23 DIAGNOSIS — O09293 Supervision of pregnancy with other poor reproductive or obstetric history, third trimester: Secondary | ICD-10-CM

## 2021-07-23 DIAGNOSIS — O169 Unspecified maternal hypertension, unspecified trimester: Secondary | ICD-10-CM

## 2021-07-23 DIAGNOSIS — Z3A28 28 weeks gestation of pregnancy: Secondary | ICD-10-CM | POA: Diagnosis not present

## 2021-07-23 DIAGNOSIS — O219 Vomiting of pregnancy, unspecified: Secondary | ICD-10-CM | POA: Insufficient documentation

## 2021-07-23 DIAGNOSIS — O09299 Supervision of pregnancy with other poor reproductive or obstetric history, unspecified trimester: Secondary | ICD-10-CM | POA: Insufficient documentation

## 2021-07-23 DIAGNOSIS — O10919 Unspecified pre-existing hypertension complicating pregnancy, unspecified trimester: Secondary | ICD-10-CM | POA: Insufficient documentation

## 2021-07-23 DIAGNOSIS — O10013 Pre-existing essential hypertension complicating pregnancy, third trimester: Secondary | ICD-10-CM | POA: Diagnosis not present

## 2021-07-23 DIAGNOSIS — O34219 Maternal care for unspecified type scar from previous cesarean delivery: Secondary | ICD-10-CM | POA: Insufficient documentation

## 2021-07-24 ENCOUNTER — Other Ambulatory Visit: Payer: Self-pay | Admitting: *Deleted

## 2021-07-24 DIAGNOSIS — O10913 Unspecified pre-existing hypertension complicating pregnancy, third trimester: Secondary | ICD-10-CM

## 2021-07-24 DIAGNOSIS — O34219 Maternal care for unspecified type scar from previous cesarean delivery: Secondary | ICD-10-CM

## 2021-07-24 DIAGNOSIS — Z6839 Body mass index (BMI) 39.0-39.9, adult: Secondary | ICD-10-CM

## 2021-07-25 ENCOUNTER — Ambulatory Visit (INDEPENDENT_AMBULATORY_CARE_PROVIDER_SITE_OTHER): Payer: Medicaid Other | Admitting: Obstetrics

## 2021-07-25 ENCOUNTER — Other Ambulatory Visit: Payer: Self-pay

## 2021-07-25 VITALS — BP 126/84 | Wt 258.0 lb

## 2021-07-25 DIAGNOSIS — Z3A30 30 weeks gestation of pregnancy: Secondary | ICD-10-CM

## 2021-07-25 DIAGNOSIS — Z23 Encounter for immunization: Secondary | ICD-10-CM

## 2021-07-25 DIAGNOSIS — Z1379 Encounter for other screening for genetic and chromosomal anomalies: Secondary | ICD-10-CM

## 2021-07-25 DIAGNOSIS — O0990 Supervision of high risk pregnancy, unspecified, unspecified trimester: Secondary | ICD-10-CM

## 2021-07-25 LAB — POCT URINALYSIS DIPSTICK OB
Glucose, UA: NEGATIVE
POC,PROTEIN,UA: NEGATIVE

## 2021-07-25 NOTE — Addendum Note (Signed)
Addended by: Mirna Mires on: 07/25/2021 11:56 AM   Modules accepted: Orders

## 2021-07-25 NOTE — Progress Notes (Signed)
Rob - no concerns. RM 4

## 2021-07-25 NOTE — Progress Notes (Signed)
Routine Prenatal Care Visit  Subjective  Crystal Choi is a 35 y.o. G3P1011 at [redacted]w[redacted]d being seen today for ongoing prenatal care.  She is currently monitored for the following issues for this high-risk pregnancy and has Supervision of high risk pregnancy, antepartum; Obesity affecting pregnancy; History of cesarean section complicating pregnancy; Hx of pre-eclampsia in prior pregnancy, currently pregnant; Elevated blood pressure affecting pregnancy, antepartum; Nausea and vomiting during pregnancy; History of abnormal cervical Pap smear; and Hemoglobinopathy (HCC) on their problem list.  ----------------------------------------------------------------------------------- Patient reports no complaints.  She knows that she will start with biweekly NSTs next visit in two weeks.  .  .   Pincus Large Fluid denies.  ----------------------------------------------------------------------------------- The following portions of the patient's history were reviewed and updated as appropriate: allergies, current medications, past family history, past medical history, past social history, past surgical history and problem list. Problem list updated.  Objective  Blood pressure 126/84, weight 258 lb (117 kg), last menstrual period 01/02/2021. Pregravid weight 236 lb (107 kg) Total Weight Gain 22 lb (9.979 kg) Urinalysis: Urine Protein    Urine Glucose    Fetal Status:           General:  Alert, oriented and cooperative. Patient is in no acute distress.  Skin: Skin is warm and dry. No rash noted.   Cardiovascular: Normal heart rate noted  Respiratory: Normal respiratory effort, no problems with respiration noted  Abdomen: Soft, gravid, appropriate for gestational age.       Pelvic:  Cervical exam deferred        Extremities: Normal range of motion.     Mental Status: Normal mood and affect. Normal behavior. Normal judgment and thought content.   Assessment   35 y.o. G3P1011 at [redacted]w[redacted]d by   10/01/2021, by Ultrasound presenting for routine prenatal visit  Plan   July 2022 Problems (from 05/23/21 to present)    Problem Noted Resolved   Supervision of high risk pregnancy, antepartum 05/23/2021 by Conard Novak, MD No   Overview Addendum 07/25/2021 11:25 AM by Mirna Mires, CNM     Nursing Staff Provider  Office Location  [facility] Dating  19 wk [place]  Language  English Anatomy US  normal  Flu Vaccine   Genetic Screen  NIPS: normal xy  TDaP vaccine   07/25/2021 Hgb A1C or  GTT Third trimester :   Covid    LAB RESULTS   Rhogam  Not needed Blood Type O/Positive/-- (07/20 0944)   Feeding Plan  Antibody Negative (07/20 0944)  Contraception  Rubella 3.00 (07/20 0944)  Circumcision  RPR Non Reactive (07/20 0944)   Pediatrician   HBsAg Negative (07/20 0944)   Support Person  HIV Non Reactive (07/20 0944)  Prenatal Classes  Varicella  Immune    GBS  (For PCN allergy, check sensitivities)   BTL Consent     VBAC Consent  Pap  02/25/2018- NIL    Hgb Electro  normal    CF      SMA         CHTN History of a cesarean section- desires repeat History of preeclampsia      Obesity affecting pregnancy 05/23/2021 by Conard Novak, MD No   History of cesarean section complicating pregnancy 05/23/2021 by Conard Novak, MD No   Hx of pre-eclampsia in prior pregnancy, currently pregnant 05/23/2021 by Conard Novak, MD No   Elevated blood pressure affecting pregnancy, antepartum 05/23/2021 by Conard Novak, MD No  Nausea and vomiting during pregnancy 05/23/2021 by Conard Novak, MD No       Preterm labor symptoms and general obstetric precautions including but not limited to vaginal bleeding, contractions, leaking of fluid and fetal movement were reviewed in detail with the patient. Please refer to After Visit Summary for other counseling recommendations.  She received her tdap,flu shots today To start weekly  NSTS next visit.  Return in about 2 weeks (around  08/08/2021) for return OB.  Mirna Mires, CNM  07/25/2021 11:29 AM

## 2021-08-08 ENCOUNTER — Encounter: Payer: Self-pay | Admitting: Obstetrics & Gynecology

## 2021-08-08 ENCOUNTER — Other Ambulatory Visit: Payer: Self-pay

## 2021-08-08 ENCOUNTER — Ambulatory Visit (INDEPENDENT_AMBULATORY_CARE_PROVIDER_SITE_OTHER): Payer: Medicaid Other | Admitting: Obstetrics & Gynecology

## 2021-08-08 VITALS — BP 136/80 | Wt 258.0 lb

## 2021-08-08 DIAGNOSIS — O0990 Supervision of high risk pregnancy, unspecified, unspecified trimester: Secondary | ICD-10-CM

## 2021-08-08 DIAGNOSIS — O10919 Unspecified pre-existing hypertension complicating pregnancy, unspecified trimester: Secondary | ICD-10-CM

## 2021-08-08 DIAGNOSIS — O99213 Obesity complicating pregnancy, third trimester: Secondary | ICD-10-CM

## 2021-08-08 DIAGNOSIS — O34219 Maternal care for unspecified type scar from previous cesarean delivery: Secondary | ICD-10-CM

## 2021-08-08 NOTE — Patient Instructions (Signed)

## 2021-08-08 NOTE — Progress Notes (Signed)
NST today. No vb. No lof.  °

## 2021-08-08 NOTE — Progress Notes (Signed)
Prenatal Visit Note Date: 08/08/2021 Clinic: [facility]  Subjective:  Crystal Choi is a 35 y.o. G3P1011 at [redacted]w[redacted]d being seen today for ongoing prenatal care.  She is currently monitored for the following issues for this high-risk pregnancy and has Supervision of high risk pregnancy, antepartum; Obesity affecting pregnancy; History of cesarean section complicating pregnancy; Hx of pre-eclampsia in prior pregnancy, currently pregnant; Elevated blood pressure affecting pregnancy, antepartum; Nausea and vomiting during pregnancy; History of abnormal cervical Pap smear; and Hemoglobinopathy (HCC) on their problem list.  Patient reports no complaints and denies headache, blurry vision, epig pain, CP, SOB, aor edema.  Taking Labetalol 400 mg BID.Marland Kitchen   Contractions: Not present. Vag. Bleeding: None.  Movement: Present. Denies leaking of fluid.   The following portions of the patient's history were reviewed and updated as appropriate: allergies, current medications, past family history, past medical history, past social history, past surgical history and problem list. Problem list updated.  Objective:   Vitals:   08/08/21 1056  BP: 136/80  Weight: 258 lb (117 kg)    Fetal Status:     Movement: Present     General:  Alert, oriented and cooperative. Patient is in no acute distress.  Skin: Skin is warm and dry. No rash noted.   Cardiovascular: Normal heart rate noted  Respiratory: Normal respiratory effort, no problems with respiration noted  Abdomen: Soft, gravid, appropriate for gestational age. Pain/Pressure: Absent     Pelvic:  Cervical exam deferred        Extremities: Normal range of motion.     Mental Status: Normal mood and affect. Normal behavior. Normal judgment and thought content.   A NST procedure was performed with FHR monitoring and a normal baseline established, appropriate time of 20-40 minutes of evaluation, and accels >2 seen w 15x15 characteristics.  Results show a  REACTIVE NST.   Assessment and Plan:  Pregnancy: G3P1011 at [redacted]w[redacted]d  1. Supervision of high risk pregnancy, antepartum PNV, FMC, PTL precautions  2. Obesity affecting pregnancy in third trimester APT  3. Chronic hypertension affecting pregnancy Labetalol  Prior Cesarean Section, desires repeat    Sch for 09/18/2021 at 38 weeks due to gest HTN on meds    Option to do sooner if poorly controlled or non reassuring APT  Preterm labor symptoms and general obstetric precautions including but not limited to vaginal bleeding, contractions, leaking of fluid and fetal movement were reviewed in detail with the patient. Please refer to After Visit Summary for other counseling recommendations.        Nursing Staff Provider  Office Location  [facility] Dating  19 wk [place]  Language  English Anatomy US  normal  Flu Vaccine  07/25/2021 Genetic Screen  NIPS: normal xy  TDaP vaccine   07/25/2021 Hgb A1C or  GTT Third trimester :   Covid    LAB RESULTS   Rhogam  Not needed Blood Type O/Positive/-- (07/20 0944)   Feeding Plan  Antibody Negative (07/20 0944)  Contraception  Rubella 3.00 (07/20 0944)  Circumcision  RPR Non Reactive (07/20 0944)   Pediatrician   HBsAg Negative (07/20 0944)   Support Person  HIV Non Reactive (07/20 0944)  Prenatal Classes  Varicella  Immune    GBS  (For PCN allergy, check sensitivities)   BTL Consent     VBAC Consent  Pap  02/25/2018- NIL    Hgb Electro  normal    CF      SMA  CHTN History of a cesarean section- desires repeat History of preeclampsia        Return in about 1 week (around 08/15/2021) for HROB w NST; keep other appts too, also sch HROB/preop w PH on Nov 8.  Annamarie Major, MD, [facility], [facility] 08/08/2021  11:41 AM

## 2021-08-09 ENCOUNTER — Other Ambulatory Visit: Payer: Self-pay | Admitting: Obstetrics & Gynecology

## 2021-08-09 DIAGNOSIS — O99213 Obesity complicating pregnancy, third trimester: Secondary | ICD-10-CM

## 2021-08-09 DIAGNOSIS — O10919 Unspecified pre-existing hypertension complicating pregnancy, unspecified trimester: Secondary | ICD-10-CM

## 2021-08-13 ENCOUNTER — Other Ambulatory Visit: Payer: Self-pay | Admitting: Obstetrics & Gynecology

## 2021-08-13 DIAGNOSIS — O10919 Unspecified pre-existing hypertension complicating pregnancy, unspecified trimester: Secondary | ICD-10-CM

## 2021-08-15 ENCOUNTER — Other Ambulatory Visit: Payer: Self-pay

## 2021-08-15 ENCOUNTER — Ambulatory Visit: Payer: Medicaid Other | Admitting: *Deleted

## 2021-08-15 ENCOUNTER — Ambulatory Visit: Payer: Medicaid Other | Attending: Obstetrics & Gynecology

## 2021-08-15 ENCOUNTER — Ambulatory Visit: Payer: Medicaid Other

## 2021-08-15 ENCOUNTER — Other Ambulatory Visit: Payer: Self-pay | Admitting: *Deleted

## 2021-08-15 ENCOUNTER — Encounter: Payer: Self-pay | Admitting: *Deleted

## 2021-08-15 VITALS — BP 150/95 | HR 94

## 2021-08-15 DIAGNOSIS — O10919 Unspecified pre-existing hypertension complicating pregnancy, unspecified trimester: Secondary | ICD-10-CM | POA: Insufficient documentation

## 2021-08-15 DIAGNOSIS — O34219 Maternal care for unspecified type scar from previous cesarean delivery: Secondary | ICD-10-CM

## 2021-08-15 DIAGNOSIS — O169 Unspecified maternal hypertension, unspecified trimester: Secondary | ICD-10-CM | POA: Insufficient documentation

## 2021-08-15 DIAGNOSIS — O09299 Supervision of pregnancy with other poor reproductive or obstetric history, unspecified trimester: Secondary | ICD-10-CM | POA: Diagnosis present

## 2021-08-15 DIAGNOSIS — O219 Vomiting of pregnancy, unspecified: Secondary | ICD-10-CM

## 2021-08-15 DIAGNOSIS — O0990 Supervision of high risk pregnancy, unspecified, unspecified trimester: Secondary | ICD-10-CM | POA: Diagnosis present

## 2021-08-15 DIAGNOSIS — O99213 Obesity complicating pregnancy, third trimester: Secondary | ICD-10-CM | POA: Diagnosis present

## 2021-08-15 DIAGNOSIS — R638 Other symptoms and signs concerning food and fluid intake: Secondary | ICD-10-CM

## 2021-08-15 DIAGNOSIS — O10913 Unspecified pre-existing hypertension complicating pregnancy, third trimester: Secondary | ICD-10-CM

## 2021-08-17 ENCOUNTER — Ambulatory Visit (INDEPENDENT_AMBULATORY_CARE_PROVIDER_SITE_OTHER): Payer: Medicaid Other | Admitting: Obstetrics & Gynecology

## 2021-08-17 ENCOUNTER — Other Ambulatory Visit: Payer: Self-pay

## 2021-08-17 VITALS — BP 128/82 | Wt 258.0 lb

## 2021-08-17 DIAGNOSIS — Z3A33 33 weeks gestation of pregnancy: Secondary | ICD-10-CM

## 2021-08-17 DIAGNOSIS — O10919 Unspecified pre-existing hypertension complicating pregnancy, unspecified trimester: Secondary | ICD-10-CM | POA: Diagnosis not present

## 2021-08-17 DIAGNOSIS — O34219 Maternal care for unspecified type scar from previous cesarean delivery: Secondary | ICD-10-CM

## 2021-08-17 DIAGNOSIS — O0990 Supervision of high risk pregnancy, unspecified, unspecified trimester: Secondary | ICD-10-CM

## 2021-08-17 DIAGNOSIS — O99213 Obesity complicating pregnancy, third trimester: Secondary | ICD-10-CM

## 2021-08-17 NOTE — Progress Notes (Signed)
  Subjective  Fetal Movement? yes Contractions? no Leaking Fluid? no Vaginal Bleeding? no Denies ha, bl vis, CP, SOB, epig pain, edema.  Objective  BP 128/82   Wt 258 lb (117 kg)   LMP 01/02/2021 (Exact Date) Comment: pt approx [redacted] weeks pregnant  BMI 42.93 kg/m  General: NAD Pumonary: no increased work of breathing Abdomen: gravid, non-tender Extremities: no edema Psychiatric: mood appropriate, affect full  A NST procedure was performed with FHR monitoring and a normal baseline established, appropriate time of 20-40 minutes of evaluation, and accels >2 seen w 15x15 characteristics.  Results show a REACTIVE NST.   Assessment  35 y.o. G3P1011 at [redacted]w[redacted]d by  10/01/2021, by Ultrasound presenting for routine prenatal visit  Plan   Problem List Items Addressed This Visit    Supervision of high risk pregnancy, antepartum  - PNV, FMC - PTL precautions   Obesity affecting pregnancy - APT weekly - Sees MFM for AFI, growth   History of cesarean section complicating pregnancy - Planning CS - Has CS scheduled for 11/15; sooner if necessary based on HTN, APT   Chronic hypertension affecting pregnancy  - Cont Labetalol      [redacted] weeks gestation of pregnancy          July 2022 Problems (from 05/23/21 to present)    Problem     Supervision of high risk pregnancy, antepartum     Overview Addendum 07/25/2021 11:56 AM by Mirna Mires, CNM     Nursing Staff Provider  Office Location  [facility] Dating  19 wk [place]  Language  English Anatomy US  normal  Flu Vaccine  07/25/2021 Genetic Screen  NIPS: normal xy  TDaP vaccine   07/25/2021 Hgb A1C or  GTT Third trimester :   Covid    LAB RESULTS   Rhogam  Not needed Blood Type O/Positive/-- (07/20 0944)   Feeding Plan  Antibody Negative (07/20 0944)  Contraception  Rubella 3.00 (07/20 0944)  Circumcision  RPR Non Reactive (07/20 0944)   Pediatrician   HBsAg Negative (07/20 0944)   Support Person  HIV Non Reactive (07/20 0944)  Prenatal  Classes  Varicella  Immune    GBS  (For PCN allergy, check sensitivities)   BTL Consent     VBAC Consent  Pap  02/25/2018- NIL    Hgb Electro  normal    CF      SMA         CHTN History of a cesarean section- desires repeat History of preeclampsia      Obesity affecting pregnancy     Overview Signed 08/08/2021 11:45 AM by Nadara Mustard, MD    BMI >=35.0 [x ] early 1h gtt nml   [x ] u/s for dating [ x]  [x ] nutritional goals [x ] folic acid 1mg  [x ] bASA (>12 weeks) [ ]  consider nutrition consult [ ]  consider maternal EKG 1st trimester [x ] Growth u/s 28 [ ] , 32 [ ] , 36 weeks [ ]  [x ] NST/AFI weekly 37+ weeks (37[] , 38[] , 39[] , 40[] ) [x ] IOL by 41 weeks (scheduled, prn [] )       History of cesarean section complicating pregnancy     Overview Signed 08/08/2021 11:45 AM by , MD    Desires Repeat CS      , MD, Ob/Gyn, [facility] 08/17/2021  3:12 PM

## 2021-08-17 NOTE — Patient Instructions (Signed)
Back Pain in Pregnancy Back pain during pregnancy is common. Back pain may be caused by several factors that are related to changes during your pregnancy, including: Your growing baby and uterus. This may result in a changing center of gravity and cause your abdominal muscles to weaken. Pregnancy hormones, which may cause the ligaments of the pelvis to relax. Weight gain during pregnancy. Your posture or position. Follow these instructions at home: Managing pain and stiffness   If directed, put ice on the painful area. To do this: Put ice in a plastic bag. Place a towel between your skin and the bag. Leave the ice on for 20 minutes, 2-3 times per day. Remove the ice if your skin turns bright red. This is very important. If you cannot feel pain, heat, or cold, you have a greater risk of damage to the area. If directed, apply heat to the affected area before you exercise. Use the heat source that your health care provider recommends, such as a moist heat pack or a heating pad. Place a towel between your skin and the heat source. Leave the heat on for 20-30 minutes. Remove the heat if your skin turns bright red. This is especially important if you are unable to feel pain, heat, or cold. You may have a greater risk of getting burned. If directed, massage the affected area. Activity Exercise as told by your health care provider. Gentle exercise is the best way to prevent or manage back pain. Listen to your body when lifting. If lifting hurts, ask for help. Squat down when picking up something from the floor. Do not bend over. Squatting uses your leg muscles instead of your back muscles. Only use bed rest for short periods as told by your health care provider. Bed rest should only be used for the most severe episodes of back pain. Standing, sitting, and lying down Do not stand in one place for long periods of time. Use good posture when sitting. Make sure your head rests over your shoulders and  is not hanging forward. Use a pillow on your lower back if necessary. Try sleeping on your side with a pregnancy support pillow or one or two regular pillows between your legs. It may be best to sleep on your left side. If you have back pain after a night's rest, your bed may be too soft. A firm mattress may provide more support for your back during pregnancy. General instructions Take over-the-counter and prescription medicines only as told by your health care provider. Use a maternity girdle, elastic sling, or back brace as told by your health care provider. Work with a physical therapist or massage therapist to find ways to manage back pain. Acupuncture or massage therapy may be helpful. Eat a healthy diet. Try to gain weight within your health care provider's recommendations. Do not wear high heels. Keep all follow-up visits. This is important. Contact a health care provider if: Your back pain interferes with your daily activities. You have back pain on one side of your back. You have increasing pain in other parts of your body. You have numbness, tingling, weakness, or problems with the use of your arms or legs. You have numbness that travels down one or both of your legs. You have nausea, vomiting, or sweating. Get help right away if: You develop any of the following: Shortness of breath, dizziness, or fainting. Severe back pain that is not controlled with medicine. Changes in bowel or bladder control, or you see blood in   your urine. You have back pain that is a rhythmic, cramping pain similar to labor pains. Labor pains are usually 2 minutes apart, last for about 1 minute, and involve a bearing down feeling or pressure in your pelvis. You have back pain and your water breaks or you have bleeding from your vagina. Your back pain developed after you fell. These symptoms may represent a serious problem that is an emergency. Do not wait to see if the symptoms will go away. Get medical  help right away. Call your local emergency services (911 in the [place] Do not drive yourself to the hospital. Summary Back pain may be caused by several factors that are related to changes during your pregnancy. Follow instructions as told by your health care provider for managing back pain. Take over-the-counter and prescription medicines only as told by your health care provider. Exercise as told by your health care provider. Gentle exercise is the best way to prevent or manage back pain. Keep all follow-up visits. This is important. This information is not intended to replace advice given to you by your health care provider. Make sure you discuss any questions you have with your health care provider. Document Revised: 01/03/2021 Document Reviewed: 01/03/2021 Else[employer] Patient Education  2022 [employer]  

## 2021-08-19 ENCOUNTER — Observation Stay
Admission: EM | Admit: 2021-08-19 | Discharge: 2021-08-19 | Disposition: A | Discharge status: Home / Self Care | Payer: Medicaid Other | Attending: Obstetrics & Gynecology | Admitting: Obstetrics & Gynecology

## 2021-08-19 ENCOUNTER — Encounter: Payer: Self-pay | Admitting: Obstetrics & Gynecology

## 2021-08-19 ENCOUNTER — Other Ambulatory Visit: Payer: Self-pay

## 2021-08-19 DIAGNOSIS — M549 Dorsalgia, unspecified: Secondary | ICD-10-CM | POA: Diagnosis not present

## 2021-08-19 DIAGNOSIS — Z7982 Long term (current) use of aspirin: Secondary | ICD-10-CM | POA: Diagnosis not present

## 2021-08-19 DIAGNOSIS — O10013 Pre-existing essential hypertension complicating pregnancy, third trimester: Secondary | ICD-10-CM

## 2021-08-19 DIAGNOSIS — O169 Unspecified maternal hypertension, unspecified trimester: Secondary | ICD-10-CM

## 2021-08-19 DIAGNOSIS — O34219 Maternal care for unspecified type scar from previous cesarean delivery: Secondary | ICD-10-CM

## 2021-08-19 DIAGNOSIS — O26893 Other specified pregnancy related conditions, third trimester: Secondary | ICD-10-CM | POA: Diagnosis not present

## 2021-08-19 DIAGNOSIS — O219 Vomiting of pregnancy, unspecified: Secondary | ICD-10-CM

## 2021-08-19 DIAGNOSIS — O09299 Supervision of pregnancy with other poor reproductive or obstetric history, unspecified trimester: Secondary | ICD-10-CM

## 2021-08-19 DIAGNOSIS — Z3A33 33 weeks gestation of pregnancy: Secondary | ICD-10-CM | POA: Insufficient documentation

## 2021-08-19 DIAGNOSIS — O99891 Other specified diseases and conditions complicating pregnancy: Secondary | ICD-10-CM

## 2021-08-19 DIAGNOSIS — O99213 Obesity complicating pregnancy, third trimester: Secondary | ICD-10-CM

## 2021-08-19 DIAGNOSIS — O0990 Supervision of high risk pregnancy, unspecified, unspecified trimester: Secondary | ICD-10-CM

## 2021-08-19 DIAGNOSIS — Z87891 Personal history of nicotine dependence: Secondary | ICD-10-CM | POA: Insufficient documentation

## 2021-08-19 LAB — URINALYSIS, ROUTINE W REFLEX MICROSCOPIC
Bilirubin Urine: NEGATIVE
Glucose, UA: NEGATIVE mg/dL
Ketones, ur: NEGATIVE mg/dL
Leukocytes,Ua: NEGATIVE
Nitrite: NEGATIVE
Protein, ur: NEGATIVE mg/dL
Specific Gravity, Urine: 1.024 (ref 1.005–1.030)
pH: 5 (ref 5.0–8.0)

## 2021-08-19 LAB — CBC
HCT: 31.3 % — ABNORMAL LOW (ref 36.0–46.0)
Hemoglobin: 10.8 g/dL — ABNORMAL LOW (ref 12.0–15.0)
MCH: 29.6 pg (ref 26.0–34.0)
MCHC: 34.5 g/dL (ref 30.0–36.0)
MCV: 85.8 fL (ref 80.0–100.0)
Platelets: 163 10*3/uL (ref 150–400)
RBC: 3.65 MIL/uL — ABNORMAL LOW (ref 3.87–5.11)
RDW: 14.2 % (ref 11.5–15.5)
WBC: 11.8 10*3/uL — ABNORMAL HIGH (ref 4.0–10.5)
nRBC: 0 % (ref 0.0–0.2)

## 2021-08-19 LAB — COMPREHENSIVE METABOLIC PANEL
ALT: 15 U/L (ref 0–44)
AST: 15 U/L (ref 15–41)
Albumin: 2.8 g/dL — ABNORMAL LOW (ref 3.5–5.0)
Alkaline Phosphatase: 73 U/L (ref 38–126)
Anion gap: 6 (ref 5–15)
BUN: 9 mg/dL (ref 6–20)
CO2: 24 mmol/L (ref 22–32)
Calcium: 9 mg/dL (ref 8.9–10.3)
Chloride: 105 mmol/L (ref 98–111)
Creatinine, Ser: 0.62 mg/dL (ref 0.44–1.00)
GFR, Estimated: >60 mL/min (ref >60)
Glucose, Bld: 92 mg/dL (ref 70–99)
Potassium: 3.8 mmol/L (ref 3.5–5.1)
Sodium: 135 mmol/L (ref 135–145)
Total Bilirubin: 0.5 mg/dL (ref 0.3–1.2)
Total Protein: 6.4 g/dL — ABNORMAL LOW (ref 6.5–8.1)

## 2021-08-19 LAB — PROTEIN / CREATININE RATIO, URINE
Creatinine, Urine: 172 mg/dL
Protein Creatinine Ratio: 0.05 mg/mg{Cre} (ref 0.00–0.15)
Total Protein, Urine: 9 mg/dL

## 2021-08-19 MED ORDER — CYCLOBENZAPRINE HCL 10 MG PO TABS
10.0000 mg | ORAL_TABLET | Freq: Once | ORAL | Status: AC
  Administered 2021-08-19: 10 mg via ORAL
  Filled 2021-08-19: qty 1

## 2021-08-19 MED ORDER — ACETAMINOPHEN 500 MG PO TABS
1000.0000 mg | ORAL_TABLET | Freq: Once | ORAL | Status: AC
  Administered 2021-08-19: 1000 mg via ORAL
  Filled 2021-08-19: qty 2

## 2021-08-19 NOTE — Discharge Instructions (Addendum)
Per Margarer Eunice Blase CNM, go get an over the counter ferrous sulfate and start taking once daily with your prenatal vitamin.

## 2021-08-19 NOTE — Final Progress Note (Signed)
Final Progress Note  Patient ID: Crystal Choi MRN: [id number] DOB/AGE: Feb 03, 1986 35 y.o.  Admit date: 08/19/2021 Admitting provider: Mirna Mires, CNM Discharge date: 08/19/2021   Admission Diagnoses: Abdominal pain in pregnancy Back pain in pregnancy  Discharge Diagnoses:  Active Problems:   * No active hospital problems. *  Reactive fetal heart tones Hypertension of pregnancy Chronic hypertension  History of Present Illness: The patient is a 35 y.o. female G3P1011 at [redacted]w[redacted]d who presents for complaint of back pain and some pelvic pressure. 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 Her back began hurting later in the day. She denies any contractions, denies LOF or vaginal bleeding. 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 She is presently on Labetalol for elevated blood pressure this pregnancy.   Past Medical History:  Diagnosis Date   Family history of ovarian cancer 07/2021   cancer genetic testing letter sent   Medical history non-contributory    No known health problems     Past Surgical History:  Procedure Laterality Date   CESAREAN SECTION     WRIST FRACTURE SURGERY      No current facility-administered medications on file prior to encounter.   Current Outpatient Medications on File Prior to Encounter  Medication Sig Dispense Refill   aspirin EC 81 MG tablet Take 81 mg by mouth daily. Swallow whole.     labetalol (NORMODYNE) 200 MG tablet Take 2 tablets (400 mg total) by mouth 2 (two) times daily. 60 tablet 11   Prenatal MV & Min w/FA-DHA (PRENATAL GUMMIES PO) Take by mouth.      No Known Allergies  Social History   Socioeconomic History   Marital status: Single    Spouse name: Not on file   Number of children: Not on file   Years of education: Not on file   Highest education level: Not on file  Occupational History   Not on file  Tobacco Use    Smoking status: Former    Types: Cigarettes   Smokeless tobacco: Never  Vaping Use   Vaping Use: Never used  Substance and Sexual Activity   Alcohol use: Not Currently    Comment: a little bit   Drug use: No   Sexual activity: Yes    Birth control/protection: None  Other Topics Concern   Not on file  Social History Narrative   Not on file   Social Determinants of Health   Financial Resource Strain: Not on file  Food Insecurity: Not on file  Transportation Needs: Not on file  Physical Activity: Not on file  Stress: Not on file  Social Connections: Not on file  Intimate Partner Violence: Not on file    Family History  Problem Relation Age of Onset   Hypertension Mother    Diabetes Father    Hypertension Father    Hypertension Sister    Hypertension Maternal Grandfather    Ovarian cancer Paternal Grandmother      ROS   Physical Exam: BP 136/83   Pulse 83   Temp 97.8 F (36.6 C) (Oral)   Resp 16   LMP 01/02/2021 (Exact Date) Comment: pt approx [redacted] weeks pregnant  OBGyn Exam  Consults: None  Significant Findings/ Diagnostic Studies: labs:  Results for orders placed or performed during the hospital encounter of 08/19/21 (from the past 24 hour(s))  Comprehensive metabolic panel     Status:  Abnormal   Collection Time: 08/19/21  9:53 PM  Result Value Ref Range   Sodium 135 135 - 145 mmol/L   Potassium 3.8 3.5 - 5.1 mmol/L   Chloride 105 98 - 111 mmol/L   CO2 24 22 - 32 mmol/L   Glucose, Bld 92 70 - 99 mg/dL   BUN 9 6 - 20 mg/dL   Creatinine, Ser 0.10 0.44 - 1.00 mg/dL   Calcium 9.0 8.9 - 93.2 mg/dL   Total Protein 6.4 (L) 6.5 - 8.1 g/dL   Albumin 2.8 (L) 3.5 - 5.0 g/dL   AST 15 15 - 41 U/L   ALT 15 0 - 44 U/L   Alkaline Phosphatase 73 38 - 126 U/L   Total Bilirubin 0.5 0.3 - 1.2 mg/dL   GFR, Estimated >35 >57 mL/min   Anion gap 6 5 - 15  CBC     Status: Abnormal   Collection Time: 08/19/21  9:53 PM  Result Value Ref Range   WBC 11.8 (H) 4.0 - 10.5 K/uL    RBC 3.65 (L) 3.87 - 5.11 MIL/uL   Hemoglobin 10.8 (L) 12.0 - 15.0 g/dL   HCT 32.2 (L) 02.5 - 42.7 %   MCV 85.8 80.0 - 100.0 fL   MCH 29.6 26.0 - 34.0 pg   MCHC 34.5 30.0 - 36.0 g/dL   RDW 06.2 37.6 - 28.3 %   Platelets 163 150 - 400 K/uL   nRBC 0.0 0.0 - 0.2 %  Protein / creatinine ratio, urine     Status: None   Collection Time: 08/19/21 10:48 PM  Result Value Ref Range   Creatinine, Urine 172 mg/dL   Total Protein, Urine 9 mg/dL   Protein Creatinine Ratio 0.05 0.00 - 0.15 mg/mg[Cre]  Urinalysis, Routine w reflex microscopic Urine, Clean Catch     Status: Abnormal   Collection Time: 08/19/21 10:48 PM  Result Value Ref Range   Color, Urine YELLOW (A) YELLOW   APPearance HAZY (A) CLEAR   Specific Gravity, Urine 1.024 1.005 - 1.030   pH 5.0 5.0 - 8.0   Glucose, UA NEGATIVE NEGATIVE mg/dL   Hgb urine dipstick SMALL (A) NEGATIVE   Bilirubin Urine NEGATIVE NEGATIVE   Ketones, ur NEGATIVE NEGATIVE mg/dL   Protein, ur NEGATIVE NEGATIVE mg/dL   Nitrite NEGATIVE NEGATIVE   Leukocytes,Ua NEGATIVE NEGATIVE   RBC / HPF 0-5 0 - 5 RBC/hpf   WBC, UA 0-5 0 - 5 WBC/hpf   Bacteria, UA RARE (A) NONE SEEN   Squamous Epithelial / LPF 0-5 0 - 5   Mucus PRESENT    BP 130/74   Pulse 80   Temp 97.8 F (36.6 C) (Oral)   Resp 16   LMP 01/02/2021 (Exact Date) Comment: pt approx [redacted] weeks pregnant   Procedures: EFM NST Baseline FHR: 120-125 beats/min Variability: moderate Accelerations: present Decelerations: absent Tocometry: no contractions noted  Interpretation:  INDICATIONS: rule out uterine contractions and chronic hypertension RESULTS:  A NST procedure was performed with FHR monitoring and a normal baseline established, appropriate time of 20-40 minutes of evaluation, and accels >2 seen w 15x15 characteristics.  Results show a REACTIVE NST.    Hospital Course: The patient was admitted to Labor and Delivery Triage for observation. She was monitored, and her FHT tracing was Category 1.  Her initial two BP readings were elevated, and a PIH panel was ordered. Serial blood pressures responded to bedrest and relaxation. Her labwork was WNL, with some borderline anemia noted. She was  provided Tylenol and flexeril for her backpain. With reassuring serial blood pressures, a reactive FHT tracing, and guidance regarding rechecking her BPs at home and suggestions for decreasing her hours at work, she was discharged home in care of her husband, with plans for follow up at the [facility] office.  Discharge Condition: good  Disposition: Discharge disposition: 01-Home or Self Care       Diet: Regular diet  Discharge Activity: Activity as tolerated  Discharge Instructions     Notify physician for a general feeling that "something is not right"   Complete by: As directed    Notify physician for increase or change in vaginal discharge   Complete by: As directed    Notify physician for intestinal cramps, with or without diarrhea, sometimes described as "gas pain"   Complete by: As directed    Notify physician for leaking of fluid   Complete by: As directed    Notify physician for low, dull backache, unrelieved by heat or Tylenol   Complete by: As directed    Notify physician for menstrual like cramps   Complete by: As directed    Notify physician for pelvic pressure   Complete by: As directed    Notify physician for uterine contractions.  These may be painless and feel like the uterus is tightening or the baby is  "balling up"   Complete by: As directed    Notify physician for vaginal bleeding   Complete by: As directed    PRETERM LABOR:  Includes any of the follwing symptoms that occur between 20 - [redacted] weeks gestation.  If these symptoms are not stopped, preterm labor can result in preterm delivery, placing your baby at risk   Complete by: As directed       Allergies as of 08/19/2021   No Known Allergies      Medication List     TAKE these medications    aspirin EC 81  MG tablet Take 81 mg by mouth daily. Swallow whole.   labetalol 200 MG tablet Commonly known as: NORMODYNE Take 2 tablets (400 mg total) by mouth 2 (two) times daily.   PRENATAL GUMMIES PO Take by mouth.         Total time spent taking care of this patient: 40 minutes  Signed: Mirna Mires, CNM  08/19/2021, 11:25 PM

## 2021-08-19 NOTE — OB Triage Note (Signed)
Patient is a  35 yo, G3P1, at 33 weeks 6 days. Patient presents with complaints of constant lower back pain rated 6/10 and vaginal pressure. Patient denies any vaginal bleeding or LOF, patient reports +FM. Patient denies any contractions. Monitors applied and assessing. Initial blood pressures 164/112 and 171/112, BP's cycling q15 min. Patient denies any headache, epigastric pain, or vision changes. Reflexes +2 bilaterally, clonus absent. Initial fetal heart tone 135 . Will continue to monitor.

## 2021-08-19 NOTE — Discharge Summary (Signed)
   Please see Final Progress note.  Mirna Mires, CNM  08/19/2021 11:24 PM

## 2021-08-19 NOTE — Progress Notes (Signed)
Discharge instructions provided to patient. Patient verbalized understanding. Red flag signs reviewed by RN. Patient discharged home with significant other in stable condition.

## 2021-08-20 ENCOUNTER — Ambulatory Visit: Payer: Medicaid Other

## 2021-08-21 ENCOUNTER — Other Ambulatory Visit: Payer: Self-pay | Admitting: Obstetrics & Gynecology

## 2021-08-21 DIAGNOSIS — O10919 Unspecified pre-existing hypertension complicating pregnancy, unspecified trimester: Secondary | ICD-10-CM

## 2021-08-22 ENCOUNTER — Ambulatory Visit (INDEPENDENT_AMBULATORY_CARE_PROVIDER_SITE_OTHER): Payer: Medicaid Other | Admitting: Obstetrics and Gynecology

## 2021-08-22 ENCOUNTER — Encounter: Payer: Self-pay | Admitting: Obstetrics and Gynecology

## 2021-08-22 ENCOUNTER — Ambulatory Visit: Payer: Medicaid Other | Attending: Obstetrics

## 2021-08-22 ENCOUNTER — Other Ambulatory Visit: Payer: Self-pay

## 2021-08-22 ENCOUNTER — Ambulatory Visit: Payer: Medicaid Other | Admitting: *Deleted

## 2021-08-22 VITALS — BP 124/72 | HR 89

## 2021-08-22 VITALS — BP 150/100 | Ht 65.0 in | Wt 263.0 lb

## 2021-08-22 DIAGNOSIS — O09299 Supervision of pregnancy with other poor reproductive or obstetric history, unspecified trimester: Secondary | ICD-10-CM | POA: Diagnosis present

## 2021-08-22 DIAGNOSIS — O0990 Supervision of high risk pregnancy, unspecified, unspecified trimester: Secondary | ICD-10-CM

## 2021-08-22 DIAGNOSIS — O10013 Pre-existing essential hypertension complicating pregnancy, third trimester: Secondary | ICD-10-CM | POA: Diagnosis not present

## 2021-08-22 DIAGNOSIS — Z3A34 34 weeks gestation of pregnancy: Secondary | ICD-10-CM | POA: Diagnosis not present

## 2021-08-22 DIAGNOSIS — Z6839 Body mass index (BMI) 39.0-39.9, adult: Secondary | ICD-10-CM

## 2021-08-22 DIAGNOSIS — O163 Unspecified maternal hypertension, third trimester: Secondary | ICD-10-CM | POA: Diagnosis not present

## 2021-08-22 DIAGNOSIS — O34219 Maternal care for unspecified type scar from previous cesarean delivery: Secondary | ICD-10-CM

## 2021-08-22 DIAGNOSIS — O219 Vomiting of pregnancy, unspecified: Secondary | ICD-10-CM | POA: Insufficient documentation

## 2021-08-22 DIAGNOSIS — O169 Unspecified maternal hypertension, unspecified trimester: Secondary | ICD-10-CM | POA: Insufficient documentation

## 2021-08-22 DIAGNOSIS — O99213 Obesity complicating pregnancy, third trimester: Secondary | ICD-10-CM | POA: Diagnosis not present

## 2021-08-22 DIAGNOSIS — O09293 Supervision of pregnancy with other poor reproductive or obstetric history, third trimester: Secondary | ICD-10-CM | POA: Diagnosis not present

## 2021-08-22 DIAGNOSIS — O0993 Supervision of high risk pregnancy, unspecified, third trimester: Secondary | ICD-10-CM | POA: Insufficient documentation

## 2021-08-22 DIAGNOSIS — O10919 Unspecified pre-existing hypertension complicating pregnancy, unspecified trimester: Secondary | ICD-10-CM | POA: Diagnosis not present

## 2021-08-22 DIAGNOSIS — O10913 Unspecified pre-existing hypertension complicating pregnancy, third trimester: Secondary | ICD-10-CM | POA: Insufficient documentation

## 2021-08-22 DIAGNOSIS — Z3A33 33 weeks gestation of pregnancy: Secondary | ICD-10-CM | POA: Diagnosis not present

## 2021-08-22 LAB — FETAL NONSTRESS TEST

## 2021-08-22 MED ORDER — LABETALOL HCL 200 MG PO TABS: 600.0000 mg | ORAL_TABLET | Freq: Two times a day (BID) | ORAL | 3 refills | Status: DC

## 2021-08-22 MED ORDER — NIFEDIPINE ER OSMOTIC RELEASE 30 MG PO TB24: 30.0000 mg | ORAL_TABLET | Freq: Every day | ORAL | 2 refills | Status: DC

## 2021-08-22 NOTE — Progress Notes (Signed)
Routine Prenatal Care Visit  Subjective  Crystal Choi is a 35 y.o. G3P1011 at [redacted]w[redacted]d being seen today for ongoing prenatal care.  She is currently monitored for the following issues for this high-risk pregnancy and has Supervision of high risk pregnancy, antepartum; Obesity affecting pregnancy; History of cesarean section complicating pregnancy; Hx of pre-eclampsia in prior pregnancy, currently pregnant; Elevated blood pressure affecting pregnancy, antepartum; Nausea and vomiting during pregnancy; History of abnormal cervical Pap smear; Hemoglobinopathy (HCC); and Back pain affecting pregnancy in third trimester on their problem list.  ----------------------------------------------------------------------------------- Patient reports that she has been taking her blood pressure at home.  Over the weekend she had elevated blood pressure in the 170s.  She presented to the hospital.  She had a negative preeclampsia evaluation.  She reports that she has not been having issues with headaches vision changes or right upper quadrant pain.  She is currently taking labetalol 400 mg twice a day at home. Contractions: Not present. Vag. Bleeding: None.  Movement: Present. Denies leaking of fluid.  ----------------------------------------------------------------------------------- The following portions of the patient's history were reviewed and updated as appropriate: allergies, current medications, past family history, past medical history, past social history, past surgical history and problem list. Problem list updated.   Objective  Blood pressure 128/76, height 5\' 5"  (1.651 m), weight 263 lb (119.3 kg), last menstrual period 01/02/2021. Pregravid weight 236 lb (107 kg) Total Weight Gain 27 lb (12.2 kg) Urinalysis:      Fetal Status: Fetal Heart Rate (bpm): `35   Movement: Present     General:  Alert, oriented and cooperative. Patient is in no acute distress.  Skin: Skin is warm and dry. No  rash noted.   Cardiovascular: Normal heart rate noted  Respiratory: Normal respiratory effort, no problems with respiration noted  Abdomen: Soft, gravid, appropriate for gestational age. Pain/Pressure: Present     Pelvic:  Cervical exam deferred        Extremities: Normal range of motion.  Edema: None  Mental Status: Normal mood and affect. Normal behavior. Normal judgment and thought content.     Assessment   35 y.o. G3P1011 at [redacted]w[redacted]d by  10/01/2021, by Ultrasound presenting for routine prenatal visit  Plan   July 2022 Problems (from 05/23/21 to present)     Problem Noted Resolved   Supervision of high risk pregnancy, antepartum 05/23/2021 by 05/25/2021, MD No   Overview Addendum 07/25/2021 11:56 AM by 07/27/2021, CNM     Nursing Staff Provider  Office Location  [facility] Dating  19 wk Mirna Mires  Language  English Anatomy US  normal  Flu Vaccine  07/25/2021 Genetic Screen  NIPS: normal xy  TDaP vaccine   07/25/2021 Hgb A1C or  GTT Third trimester :   Covid    LAB RESULTS   Rhogam  Not needed Blood Type O/Positive/-- (07/20 0944)   Feeding Plan  Antibody Negative (07/20 0944)  Contraception  Rubella 3.00 (07/20 0944)  Circumcision  RPR Non Reactive (07/20 0944)   Pediatrician   HBsAg Negative (07/20 0944)   Support Person  HIV Non Reactive (07/20 0944)  Prenatal Classes  Varicella  Immune    GBS  (For PCN allergy, check sensitivities)   BTL Consent     VBAC Consent  Pap  02/25/2018- NIL    Hgb Electro  normal    CF      SMA        CHTN History of a cesarean section- desires  repeat History of preeclampsia      Obesity affecting pregnancy 05/23/2021 by Conard Novak, MD No   Overview Signed 08/08/2021 11:45 AM by Nadara Mustard, MD    BMI >=35.0 [x ] early 1h gtt nml   [x ] u/s for dating [ x]  [x ] nutritional goals [x ] folic acid 1mg  [x ] bASA (>12 weeks) [ ]  consider nutrition consult [ ]  consider maternal EKG 1st trimester [x ] Growth u/s 28 [ ] , 32  [ ] , 36 weeks [ ]  [x ] NST/AFI weekly 37+ weeks (37[] , 38[] , 39[] , 40[] ) [x ] IOL by 41 weeks (scheduled, prn [] )       History of cesarean section complicating pregnancy 05/23/2021 by , MD No   Overview Signed 08/08/2021 11:45 AM by , MD    Desires Repeat CS      Hx of pre-eclampsia in prior pregnancy, currently pregnant 05/23/2021 by , MD No   Elevated blood pressure affecting pregnancy, antepartum 05/23/2021 by 05/25/2021, MD No   Nausea and vomiting during pregnancy 05/23/2021 by 10/08/2021, MD No       My manual cuff blood pressure was 150/100.  She did not have protein on her urine.  Will increase her blood pressure medications given her reported elevated values at home to labetalol 600 mg twice a day and additionally add Procardia 30 mg XL.  She has follow-up planned for next week.  She is also following with MFM today.  Encouraged patient to check her blood pressures twice a day and to create a blood pressure log.  Encouraged the patient to come to the hospital if she develops any headaches vision changes or right upper quadrant pain or swelling in her feet.  Discussed that she should also cover the hospital if she has severely elevated blood pressures such as a systolic values greater than 160 or diastolic values greater than 110.  NST: 140 bpm baseline, 15x15 variability, no accelerations, no decelerations.  Gestational age appropriate obstetric precautions including but not limited to vaginal bleeding, contractions, leaking of fluid and fetal movement were reviewed in detail with the patient.    Return in about 1 week (around 08/29/2021) for ROB with MD.  05/25/2021 MD [facility], [facility] 08/22/2021, 12:53 PM

## 2021-08-22 NOTE — Patient Instructions (Addendum)
[email or web address] "Daily activities"   Hypertension During Pregnancy High blood pressure (hypertension) is when the force of blood pumping through the arteries is high enough to cause problems with your health. Arteries are blood vessels that carry blood from the heart throughout the body. Hypertension during pregnancy can cause problems for you and your baby. It can be mild or severe. There are different types of hypertension that can happen during pregnancy. These include: Chronic hypertension. This happens when you had high blood pressure before you became pregnant, and it continues during the pregnancy. Hypertension that develops before you are [redacted] weeks pregnant and continues during the pregnancy is also called chronic hypertension. If you have chronic hypertension, it will not go away after you have your baby. You will need follow-up visits with your health care provider after you have your baby. Your health care provider may want you to keep taking medicine for your blood pressure. Gestational hypertension. This is hypertension that develops after the 20th week of pregnancy. Gestational hypertension usually goes away after you have your baby, but your health care provider will need to monitor your blood pressure to make sure that it is getting better. Postpartum hypertension. This is high blood pressure that was present before delivery and continues after delivery or that starts after delivery. This usually occurs within 48 hours after childbirth but may occur up to 6 weeks after giving birth. When hypertension during pregnancy is severe, it is a medical emergency that requires treatment right away. How does this affect me? Women who have hypertension during pregnancy have a greater chance of developing hypertension later in life or during future pregnancies. In some cases, hypertension during pregnancy can cause serious complications, such as: Stroke. Heart attack. Injury to other organs, such  as kidneys, lungs, or liver. Preeclampsia. A condition called hemolysis, elevated liver enzymes, and low platelet count (HELLP) syndrome. Convulsions or seizures. Placental abruption. How does this affect my baby? Hypertension during pregnancy can affect your baby. Your baby may: Be born early (prematurely). Not weigh as much as he or she should at birth (low birth weight). Not tolerate labor well, leading to an unplanned cesarean delivery. This condition may also result in a baby's death before birth (stillbirth). What are the risks? There are certain factors that make it more likely for you to develop hypertension during pregnancy. These include: Having hypertension during a previous pregnancy or a family history of hypertension. Being overweight. Being age 40 or older. Being pregnant for the first time. Being pregnant with more than one baby. Becoming pregnant using fertilization methods, such as IVF (in vitro fertilization). Having other medical problems, such as diabetes, kidney disease, or lupus. What can I do to lower my risk? The exact cause of hypertension during pregnancy is not known. You may be able to lower your risk by: Maintaining a healthy weight. Eating a healthy and balanced diet. Following your health care provider's instructions about treating any long-term conditions that you had before becoming pregnant. It is very important to keep all of your prenatal care appointments. Your health care provider will check your blood pressure and make sure that your pregnancy is progressing as expected. If a problem is found, early treatment can prevent complications. How is this treated? Treatment for hypertension during pregnancy varies depending on the type of hypertension you have and how serious it is. If you were taking medicine for high blood pressure before you became pregnant, talk with your health care provider. You may need to change  medicine during pregnancy because  some medicines, like ACE inhibitors, may not be considered safe for your baby. If you have gestational hypertension, your health care provider may order medicine to treat this during pregnancy. If you are at risk for preeclampsia, your health care provider may recommend that you take a low-dose aspirin during your pregnancy. If you have severe hypertension, you may need to be hospitalized so you and your baby can be monitored closely. You may also need to be given medicine to lower your blood pressure. In some cases, if your condition gets worse, you may need to deliver your baby early. Follow these instructions at home: Eating and drinking  Drink enough fluid to keep your urine pale yellow. Avoid caffeine. Lifestyle Do not use any products that contain nicotine or tobacco. These products include cigarettes, chewing tobacco, and vaping devices, such as e-cigarettes. If you need help quitting, ask your health care provider. Do not use alcohol or drugs. Avoid stress as much as possible. Rest and get plenty of sleep. Regular exercise can help to reduce your blood pressure. Ask your health care provider what kinds of exercise are best for you. General instructions Take over-the-counter and prescription medicines only as told by your health care provider. Keep all prenatal and follow-up visits. This is important. Contact a health care provider if: You have symptoms that your health care provider told you may require more treatment or monitoring, such as: Headaches. Nausea or vomiting. Abdominal pain. Dizziness. Light-headedness. Get help right away if: You have symptoms of serious complications, such as: Severe abdominal pain that does not get better with treatment. A severe headache that does not get better, blurred vision, or double vision. Vomiting that does not get better. Sudden, rapid weight gain or swelling in your hands, ankles, or face. Vaginal bleeding. Blood in your  urine. Shortness of breath or chest pain. Weakness on one side of your body or difficulty speaking. Your baby is not moving as much as usual. These symptoms may represent a serious problem that is an emergency. Do not wait to see if the symptoms will go away. Get medical help right away. Call your local emergency services (911 in the [place] Do not drive yourself to the hospital. Summary Hypertension during pregnancy can cause problems for you and your baby. Treatment for hypertension during pregnancy varies depending on the type of hypertension you have and how serious it is. Keep all prenatal and follow-up visits. This is important. Get help right away if you have symptoms of serious complications related to high blood pressure. This information is not intended to replace advice given to you by your health care provider. Make sure you discuss any questions you have with your health care provider. Document Revised: 07/13/2020 Document Reviewed: 07/13/2020 Elsevier Patient Education  2022 [employer] How to Take Your Blood Pressure Blood pressure is a measurement of how strongly your blood is pressing against the walls of your arteries. Arteries are blood vessels that carry blood from your heart throughout your body. Your health care provider takes your blood pressure at each office visit. You can also take your own blood pressure at home with a blood pressure monitor. You may need to take your own blood pressure to: Confirm a diagnosis of high blood pressure (hypertension). Monitor your blood pressure over time. Make sure your blood pressure medicine is working. Supplies needed: Blood pressure monitor. Dining room chair to sit in. Table or desk. Small notebook and pencil or pen. How to  prepare To get the most accurate reading, avoid the following for 30 minutes before you check your blood pressure: Drinking caffeine. Drinking alcohol. Eating. Smoking. Exercising. Five minutes before  you check your blood pressure: Use the bathroom and urinate so that you have an empty bladder. Sit quietly in a dining room chair. Do not sit in a soft couch or an armchair. Do not talk. How to take your blood pressure To check your blood pressure, follow the instructions in the manual that came with your blood pressure monitor. If you have a digital blood pressure monitor, the instructions may be as follows: Sit up straight in a chair. Place your feet on the floor. Do not cross your ankles or legs. Rest your left arm at the level of your heart on a table or desk or on the arm of a chair. Pull up your shirt sleeve. Wrap the blood pressure cuff around the upper part of your left arm, 1 inch (2.5 cm) above your elbow. It is best to wrap the cuff around bare skin. Fit the cuff snugly around your arm. You should be able to place only one finger between the cuff and your arm. Position the cord so that it rests in the bend of your elbow. Press the power button. Sit quietly while the cuff inflates and deflates. Read the digital reading on the monitor screen and write the numbers down (record them) in a notebook. Wait 2-3 minutes, then repeat the steps, starting at step 1. What does my blood pressure reading mean? A blood pressure reading consists of a higher number over a lower number. Ideally, your blood pressure should be below 120/80. The first ("top") number is called the systolic pressure. It is a measure of the pressure in your arteries as your heart beats. The second ("bottom") number is called the diastolic pressure. It is a measure of the pressure in your arteries as the heart relaxes. Blood pressure is classified into five stages. The following are the stages for adults who do not have a short-term serious illness or a chronic condition. Systolic pressure and diastolic pressure are measured in a unit called mm Hg (millimeters of mercury).  Normal Systolic pressure: below 120. Diastolic  pressure: below 80. Elevated Systolic pressure: 120-129. Diastolic pressure: below 80. Hypertension stage 1 Systolic pressure: 130-139. Diastolic pressure: 80-89. Hypertension stage 2 Systolic pressure: 140 or above. Diastolic pressure: 90 or above. You can have elevated blood pressure or hypertension even if only the systolic or only the diastolic number in your reading is higher than normal. Follow these instructions at home: Medicines Take over-the-counter and prescription medicines only as told by your health care provider. Tell your health care provider if you are having any side effects from blood pressure medicine. General instructions Check your blood pressure as often as recommended by your health care provider. Check your blood pressure at the same time every day. Take your monitor to the next appointment with your health care provider to make sure that: You are using it correctly. It provides accurate readings. Understand what your goal blood pressure numbers are. Keep all follow-up visits as told by your health care provider. This is important. General tips Your health care provider can suggest a reliable monitor that will meet your needs. There are several types of home blood pressure monitors. Choose a monitor that has an arm cuff. Do not choose a monitor that measures your blood pressure from your wrist or finger. Choose a cuff that wraps snugly  around your upper arm. You should be able to fit only one finger between your arm and the cuff. You can buy a blood pressure monitor at most drugstores or online. Where to find more information American Heart Association: www.heart.org Contact a health care provider if: Your blood pressure is consistently high. Your blood pressure is suddenly low. Get help right away if: Your systolic blood pressure is higher than 180. Your diastolic blood pressure is higher than 120. Summary Blood pressure is a measurement of how strongly  your blood is pressing against the walls of your arteries. A blood pressure reading consists of a higher number over a lower number. Ideally, your blood pressure should be below 120/80. Check your blood pressure at the same time every day. Avoid caffeine, alcohol, smoking, and exercise for 30 minutes prior to checking your blood pressure. These agents can affect the accuracy of the blood pressure reading. This information is not intended to replace advice given to you by your health care provider. Make sure you discuss any questions you have with your health care provider. Document Revised: 08/30/2020 Document Reviewed: 10/15/2019 Elsevier Patient Education  2022 [employer].

## 2021-08-27 ENCOUNTER — Ambulatory Visit (INDEPENDENT_AMBULATORY_CARE_PROVIDER_SITE_OTHER): Payer: Medicaid Other | Admitting: Obstetrics

## 2021-08-27 ENCOUNTER — Other Ambulatory Visit: Payer: Self-pay

## 2021-08-27 VITALS — BP 126/84 | Wt 263.0 lb

## 2021-08-27 DIAGNOSIS — M549 Dorsalgia, unspecified: Secondary | ICD-10-CM

## 2021-08-27 DIAGNOSIS — O0990 Supervision of high risk pregnancy, unspecified, unspecified trimester: Secondary | ICD-10-CM | POA: Diagnosis not present

## 2021-08-27 DIAGNOSIS — Z3A35 35 weeks gestation of pregnancy: Secondary | ICD-10-CM | POA: Diagnosis not present

## 2021-08-27 DIAGNOSIS — O99891 Other specified diseases and conditions complicating pregnancy: Secondary | ICD-10-CM

## 2021-08-27 MED ORDER — CYCLOBENZAPRINE HCL 10 MG PO TABS
10.0000 mg | ORAL_TABLET | Freq: Three times a day (TID) | ORAL | 0 refills | Status: DC | PRN
Start: 2021-08-27 — End: 2021-10-22
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
## 2021-08-27 NOTE — Progress Notes (Signed)
No vb. No lof. NST today °

## 2021-08-29 ENCOUNTER — Ambulatory Visit: Payer: Medicaid Other | Attending: Obstetrics and Gynecology

## 2021-08-29 ENCOUNTER — Encounter: Payer: Self-pay | Admitting: *Deleted

## 2021-08-29 ENCOUNTER — Other Ambulatory Visit: Payer: Self-pay

## 2021-08-29 ENCOUNTER — Ambulatory Visit: Payer: Medicaid Other | Admitting: *Deleted

## 2021-08-29 VITALS — BP 140/92 | HR 96

## 2021-08-29 DIAGNOSIS — O09293 Supervision of pregnancy with other poor reproductive or obstetric history, third trimester: Secondary | ICD-10-CM

## 2021-08-29 DIAGNOSIS — O0990 Supervision of high risk pregnancy, unspecified, unspecified trimester: Secondary | ICD-10-CM | POA: Diagnosis present

## 2021-08-29 DIAGNOSIS — O09299 Supervision of pregnancy with other poor reproductive or obstetric history, unspecified trimester: Secondary | ICD-10-CM | POA: Diagnosis present

## 2021-08-29 DIAGNOSIS — O99213 Obesity complicating pregnancy, third trimester: Secondary | ICD-10-CM | POA: Insufficient documentation

## 2021-08-29 DIAGNOSIS — O169 Unspecified maternal hypertension, unspecified trimester: Secondary | ICD-10-CM

## 2021-08-29 DIAGNOSIS — R638 Other symptoms and signs concerning food and fluid intake: Secondary | ICD-10-CM | POA: Diagnosis present

## 2021-08-29 DIAGNOSIS — O219 Vomiting of pregnancy, unspecified: Secondary | ICD-10-CM | POA: Insufficient documentation

## 2021-08-29 DIAGNOSIS — Z3A34 34 weeks gestation of pregnancy: Secondary | ICD-10-CM

## 2021-08-29 DIAGNOSIS — O10013 Pre-existing essential hypertension complicating pregnancy, third trimester: Secondary | ICD-10-CM | POA: Diagnosis not present

## 2021-08-29 DIAGNOSIS — O34219 Maternal care for unspecified type scar from previous cesarean delivery: Secondary | ICD-10-CM | POA: Insufficient documentation

## 2021-08-29 DIAGNOSIS — O10913 Unspecified pre-existing hypertension complicating pregnancy, third trimester: Secondary | ICD-10-CM | POA: Insufficient documentation

## 2021-08-29 DIAGNOSIS — E669 Obesity, unspecified: Secondary | ICD-10-CM

## 2021-08-31 ENCOUNTER — Other Ambulatory Visit: Payer: Self-pay | Admitting: Obstetrics & Gynecology

## 2021-08-31 DIAGNOSIS — O34219 Maternal care for unspecified type scar from previous cesarean delivery: Secondary | ICD-10-CM

## 2021-09-02 ENCOUNTER — Inpatient Hospital Stay
Admission: EM | Admit: 2021-09-02 | Discharge: 2021-09-06 | DRG: 787 | Disposition: A | Discharge status: Home / Self Care | Payer: Medicaid Other | Attending: Obstetrics and Gynecology | Admitting: Obstetrics and Gynecology

## 2021-09-02 DIAGNOSIS — Z87891 Personal history of nicotine dependence: Secondary | ICD-10-CM

## 2021-09-02 DIAGNOSIS — O99213 Obesity complicating pregnancy, third trimester: Secondary | ICD-10-CM

## 2021-09-02 DIAGNOSIS — O1490 Unspecified pre-eclampsia, unspecified trimester: Secondary | ICD-10-CM | POA: Diagnosis present

## 2021-09-02 DIAGNOSIS — O1414 Severe pre-eclampsia complicating childbirth: Principal | ICD-10-CM | POA: Diagnosis present

## 2021-09-02 DIAGNOSIS — O0990 Supervision of high risk pregnancy, unspecified, unspecified trimester: Secondary | ICD-10-CM

## 2021-09-02 DIAGNOSIS — O09299 Supervision of pregnancy with other poor reproductive or obstetric history, unspecified trimester: Secondary | ICD-10-CM

## 2021-09-02 DIAGNOSIS — D62 Acute posthemorrhagic anemia: Secondary | ICD-10-CM | POA: Diagnosis not present

## 2021-09-02 DIAGNOSIS — O9081 Anemia of the puerperium: Secondary | ICD-10-CM | POA: Diagnosis not present

## 2021-09-02 DIAGNOSIS — Z3A36 36 weeks gestation of pregnancy: Secondary | ICD-10-CM

## 2021-09-02 DIAGNOSIS — O99214 Obesity complicating childbirth: Secondary | ICD-10-CM | POA: Diagnosis present

## 2021-09-02 DIAGNOSIS — O34211 Maternal care for low transverse scar from previous cesarean delivery: Secondary | ICD-10-CM | POA: Diagnosis present

## 2021-09-02 DIAGNOSIS — O34219 Maternal care for unspecified type scar from previous cesarean delivery: Secondary | ICD-10-CM

## 2021-09-02 DIAGNOSIS — O219 Vomiting of pregnancy, unspecified: Secondary | ICD-10-CM

## 2021-09-02 DIAGNOSIS — Z20822 Contact with and (suspected) exposure to covid-19: Secondary | ICD-10-CM | POA: Diagnosis present

## 2021-09-02 DIAGNOSIS — O169 Unspecified maternal hypertension, unspecified trimester: Secondary | ICD-10-CM

## 2021-09-02 NOTE — ED Triage Notes (Signed)
Patient here due to headache that started this morning. Patient takes labetalol 600mg  BID and nifedipine 30mg  daily, as well as a baby aspirin. Patient took 325 of Tylenol at 0800 with no relief. Patient with history of preeclampsia with previous pregnancy, patient will be 36 weeks tomorrow. Patient sees Dr. at [facility], planned c-section on Nov 15th. Patient states baby has been active and moving normally. Patient endorses nausea and vomiting since 2030, patient vomited once. Patient ambulatory, AxOx4, in NAD.

## 2021-09-03 ENCOUNTER — Encounter
Admission: EM | Disposition: A | Discharge status: Home / Self Care | Payer: Self-pay | Source: Home / Self Care | Attending: Obstetrics and Gynecology

## 2021-09-03 ENCOUNTER — Inpatient Hospital Stay: Payer: Medicaid Other | Admitting: Anesthesiology

## 2021-09-03 ENCOUNTER — Other Ambulatory Visit: Payer: Self-pay

## 2021-09-03 ENCOUNTER — Encounter: Payer: Self-pay | Admitting: Obstetrics and Gynecology

## 2021-09-03 DIAGNOSIS — Z3A36 36 weeks gestation of pregnancy: Secondary | ICD-10-CM | POA: Diagnosis not present

## 2021-09-03 DIAGNOSIS — O09293 Supervision of pregnancy with other poor reproductive or obstetric history, third trimester: Secondary | ICD-10-CM | POA: Diagnosis not present

## 2021-09-03 DIAGNOSIS — O1490 Unspecified pre-eclampsia, unspecified trimester: Secondary | ICD-10-CM | POA: Diagnosis present

## 2021-09-03 DIAGNOSIS — O1414 Severe pre-eclampsia complicating childbirth: Secondary | ICD-10-CM | POA: Diagnosis present

## 2021-09-03 DIAGNOSIS — O9903 Anemia complicating the puerperium: Secondary | ICD-10-CM

## 2021-09-03 DIAGNOSIS — Z87891 Personal history of nicotine dependence: Secondary | ICD-10-CM | POA: Diagnosis not present

## 2021-09-03 DIAGNOSIS — Z20822 Contact with and (suspected) exposure to covid-19: Secondary | ICD-10-CM | POA: Diagnosis present

## 2021-09-03 DIAGNOSIS — O9081 Anemia of the puerperium: Secondary | ICD-10-CM | POA: Diagnosis not present

## 2021-09-03 DIAGNOSIS — O34211 Maternal care for low transverse scar from previous cesarean delivery: Secondary | ICD-10-CM

## 2021-09-03 DIAGNOSIS — D62 Acute posthemorrhagic anemia: Secondary | ICD-10-CM

## 2021-09-03 DIAGNOSIS — R519 Headache, unspecified: Secondary | ICD-10-CM | POA: Diagnosis present

## 2021-09-03 DIAGNOSIS — O99214 Obesity complicating childbirth: Secondary | ICD-10-CM | POA: Diagnosis present

## 2021-09-03 LAB — TYPE AND SCREEN
ABO/RH(D): O POS
Antibody Screen: NEGATIVE

## 2021-09-03 LAB — RAPID HIV SCREEN (HIV 1/2 AB+AG)
HIV 1/2 Antibodies: NONREACTIVE
HIV-1 P24 Antigen - HIV24: NONREACTIVE

## 2021-09-03 LAB — RESP PANEL BY RT-PCR (FLU A&B, COVID) ARPGX2
Influenza A by PCR: NEGATIVE
Influenza B by PCR: NEGATIVE
SARS Coronavirus 2 by RT PCR: NEGATIVE

## 2021-09-03 LAB — PROTEIN / CREATININE RATIO, URINE
Creatinine, Urine: 264 mg/dL
Protein Creatinine Ratio: 0.05 mg/mg{Cre} (ref 0.00–0.15)
Total Protein, Urine: 13 mg/dL

## 2021-09-03 LAB — COMPREHENSIVE METABOLIC PANEL
ALT: 20 U/L (ref 0–44)
AST: 22 U/L (ref 15–41)
Albumin: 3 g/dL — ABNORMAL LOW (ref 3.5–5.0)
Alkaline Phosphatase: 94 U/L (ref 38–126)
Anion gap: 8 (ref 5–15)
BUN: 8 mg/dL (ref 6–20)
CO2: 21 mmol/L — ABNORMAL LOW (ref 22–32)
Calcium: 8.5 mg/dL — ABNORMAL LOW (ref 8.9–10.3)
Chloride: 106 mmol/L (ref 98–111)
Creatinine, Ser: 0.51 mg/dL (ref 0.44–1.00)
GFR, Estimated: >60 mL/min (ref >60)
Glucose, Bld: 98 mg/dL (ref 70–99)
Potassium: 3.7 mmol/L (ref 3.5–5.1)
Sodium: 135 mmol/L (ref 135–145)
Total Bilirubin: 0.8 mg/dL (ref 0.3–1.2)
Total Protein: 6.7 g/dL (ref 6.5–8.1)

## 2021-09-03 LAB — CBC
HCT: 37.1 % (ref 36.0–46.0)
Hemoglobin: 12.2 g/dL (ref 12.0–15.0)
MCH: 28.1 pg (ref 26.0–34.0)
MCHC: 32.9 g/dL (ref 30.0–36.0)
MCV: 85.5 fL (ref 80.0–100.0)
Platelets: 137 10*3/uL — ABNORMAL LOW (ref 150–400)
RBC: 4.34 MIL/uL (ref 3.87–5.11)
RDW: 14.7 % (ref 11.5–15.5)
WBC: 12.1 10*3/uL — ABNORMAL HIGH (ref 4.0–10.5)
nRBC: 0 % (ref 0.0–0.2)

## 2021-09-03 LAB — GROUP B STREP BY PCR: Group B strep by PCR: NEGATIVE

## 2021-09-03 LAB — RPR: RPR Ser Ql: NONREACTIVE

## 2021-09-03 SURGERY — Surgical Case
Anesthesia: Spinal

## 2021-09-03 SURGERY — Surgical Case
Anesthesia: Choice

## 2021-09-03 MED ORDER — OXYTOCIN-SODIUM CHLORIDE 30-0.9 UT/500ML-% IV SOLN
INTRAVENOUS | Status: DC | PRN
  Administered 2021-09-03: 30 [IU] via INTRAVENOUS

## 2021-09-03 MED ORDER — MORPHINE SULFATE (PF) 0.5 MG/ML IJ SOLN: INTRAMUSCULAR | Status: DC | PRN

## 2021-09-03 MED ORDER — BUPIVACAINE IN DEXTROSE 0.75-8.25 % IT SOLN
INTRATHECAL | Status: DC | PRN
  Administered 2021-09-03: 1.6 mL via INTRATHECAL

## 2021-09-03 MED ORDER — CEFAZOLIN SODIUM 1 G IJ SOLR
INTRAMUSCULAR | Status: AC
  Filled 2021-09-03: qty 20

## 2021-09-03 MED ORDER — WITCH HAZEL-GLYCERIN EX PADS: 1.0000 "application " | MEDICATED_PAD | CUTANEOUS | Status: DC | PRN

## 2021-09-03 MED ORDER — NALOXONE HCL 0.4 MG/ML IJ SOLN: 0.4000 mg | INTRAMUSCULAR | Status: DC | PRN

## 2021-09-03 MED ORDER — SOD CITRATE-CITRIC ACID 500-334 MG/5ML PO SOLN: 30.0000 mL | ORAL | Status: DC

## 2021-09-03 MED ORDER — LIDOCAINE HCL (PF) 2 % IJ SOLN
INTRAMUSCULAR | Status: AC
  Filled 2021-09-03: qty 5

## 2021-09-03 MED ORDER — COCONUT OIL OIL
1.0000 "application " | TOPICAL_OIL | Status: DC | PRN
  Filled 2021-09-03: qty 120

## 2021-09-03 MED ORDER — HYDRALAZINE HCL 20 MG/ML IJ SOLN: 10.0000 mg | INTRAMUSCULAR | Status: DC | PRN

## 2021-09-03 MED ORDER — BUPIVACAINE HCL (PF) 0.5 % IJ SOLN
INTRAMUSCULAR | Status: AC
  Filled 2021-09-03: qty 30

## 2021-09-03 MED ORDER — DIPHENHYDRAMINE HCL 25 MG PO CAPS: 25.0000 mg | ORAL_CAPSULE | ORAL | Status: DC | PRN

## 2021-09-03 MED ORDER — METHYLENE BLUE 0.5 % INJ SOLN
INTRAVENOUS | Status: AC
  Filled 2021-09-03: qty 10

## 2021-09-03 MED ORDER — FENTANYL CITRATE (PF) 100 MCG/2ML IJ SOLN: 25.0000 ug | INTRAMUSCULAR | Status: DC | PRN

## 2021-09-03 MED ORDER — PHENYLEPHRINE HCL (PRESSORS) 10 MG/ML IV SOLN
INTRAVENOUS | Status: DC | PRN
  Administered 2021-09-03: 80 ug via INTRAVENOUS
  Administered 2021-09-03 (×2): 160 ug via INTRAVENOUS
  Administered 2021-09-03 (×3): 80 ug via INTRAVENOUS
  Administered 2021-09-03 (×2): 160 ug via INTRAVENOUS
  Administered 2021-09-03 (×2): 80 ug via INTRAVENOUS

## 2021-09-03 MED ORDER — ZOLPIDEM TARTRATE 5 MG PO TABS: 5.0000 mg | ORAL_TABLET | Freq: Every evening | ORAL | Status: DC | PRN

## 2021-09-03 MED ORDER — FENTANYL CITRATE (PF) 100 MCG/2ML IJ SOLN
INTRAMUSCULAR | Status: AC
  Filled 2021-09-03: qty 2

## 2021-09-03 MED ORDER — DIPHENHYDRAMINE HCL 50 MG/ML IJ SOLN
INTRAMUSCULAR | Status: DC | PRN
  Administered 2021-09-03 (×2): 12.5 mg via INTRAVENOUS

## 2021-09-03 MED ORDER — BUPIVACAINE 0.25 % ON-Q PUMP DUAL CATH 400 ML
400.0000 mL | INJECTION | Status: DC
  Filled 2021-09-03: qty 400

## 2021-09-03 MED ORDER — DIBUCAINE (PERIANAL) 1 % EX OINT: 1.0000 "application " | TOPICAL_OINTMENT | CUTANEOUS | Status: DC | PRN

## 2021-09-03 MED ORDER — LACTATED RINGERS IV SOLN: INTRAVENOUS | Status: DC

## 2021-09-03 MED ORDER — NIFEDIPINE ER OSMOTIC RELEASE 30 MG PO TB24
60.0000 mg | ORAL_TABLET | Freq: Every day | ORAL | Status: DC
  Administered 2021-09-03 – 2021-09-05 (×3): 60 mg via ORAL
  Filled 2021-09-03 (×3): qty 2

## 2021-09-03 MED ORDER — KETOROLAC TROMETHAMINE 30 MG/ML IJ SOLN
30.0000 mg | Freq: Four times a day (QID) | INTRAMUSCULAR | Status: DC | PRN
Start: 2021-09-03 — End: 2021-09-03
  Administered 2021-09-03: 30 mg via INTRAVENOUS
  Filled 2021-09-03: qty 1

## 2021-09-03 MED ORDER — BISACODYL 10 MG RE SUPP
10.0000 mg | Freq: Every day | RECTAL | Status: DC | PRN
  Filled 2021-09-03: qty 1

## 2021-09-03 MED ORDER — FLEET ENEMA 7-19 GM/118ML RE ENEM: 1.0000 | ENEMA | Freq: Every day | RECTAL | Status: DC | PRN

## 2021-09-03 MED ORDER — CEFAZOLIN SODIUM 1 G IJ SOLR
INTRAMUSCULAR | Status: AC
  Filled 2021-09-03: qty 10

## 2021-09-03 MED ORDER — MENTHOL 3 MG MT LOZG
1.0000 | LOZENGE | OROMUCOSAL | Status: DC | PRN
  Filled 2021-09-03: qty 9

## 2021-09-03 MED ORDER — BUPIVACAINE HCL 0.5 % IJ SOLN
10.0000 mL | Freq: Once | INTRAMUSCULAR | Status: DC
  Filled 2021-09-03: qty 10

## 2021-09-03 MED ORDER — LABETALOL HCL 5 MG/ML IV SOLN
80.0000 mg | INTRAVENOUS | Status: DC | PRN
  Administered 2021-09-03: 80 mg via INTRAVENOUS
  Filled 2021-09-03: qty 16

## 2021-09-03 MED ORDER — ONDANSETRON HCL 4 MG/2ML IJ SOLN: 4.0000 mg | Freq: Once | INTRAMUSCULAR | Status: DC | PRN

## 2021-09-03 MED ORDER — BUPIVACAINE IN DEXTROSE 0.75-8.25 % IT SOLN: INTRATHECAL | Status: DC | PRN

## 2021-09-03 MED ORDER — EPHEDRINE SULFATE 50 MG/ML IJ SOLN
INTRAMUSCULAR | Status: DC | PRN
  Administered 2021-09-03 (×2): 10 mg via INTRAVENOUS
  Administered 2021-09-03: 15 mg via INTRAVENOUS
  Administered 2021-09-03 (×2): 10 mg via INTRAVENOUS
  Administered 2021-09-03: 5 mg via INTRAVENOUS

## 2021-09-03 MED ORDER — OXYTOCIN-SODIUM CHLORIDE 30-0.9 UT/500ML-% IV SOLN
2.5000 [IU]/h | INTRAVENOUS | Status: DC
  Administered 2021-09-03 (×2): 2.5 [IU]/h via INTRAVENOUS
  Filled 2021-09-03: qty 500

## 2021-09-03 MED ORDER — KETOROLAC TROMETHAMINE 30 MG/ML IJ SOLN
INTRAMUSCULAR | Status: DC | PRN
Start: 2021-09-03 — End: 2021-09-03
  Administered 2021-09-03: 30 mg via INTRAVENOUS

## 2021-09-03 MED ORDER — DIPHENHYDRAMINE HCL 50 MG/ML IJ SOLN
INTRAMUSCULAR | Status: AC
  Filled 2021-09-03: qty 1

## 2021-09-03 MED ORDER — CEFAZOLIN SODIUM-DEXTROSE 2-4 GM/100ML-% IV SOLN
2.0000 g | INTRAVENOUS | Status: AC
  Administered 2021-09-03 (×2): 2 g via INTRAVENOUS
  Filled 2021-09-03: qty 100

## 2021-09-03 MED ORDER — BUPIVACAINE ON-Q PAIN PUMP (FOR ORDER SET NO CHG): INJECTION | Status: DC

## 2021-09-03 MED ORDER — NALBUPHINE HCL 10 MG/ML IJ SOLN: 5.0000 mg | INTRAMUSCULAR | Status: DC | PRN

## 2021-09-03 MED ORDER — OXYCODONE HCL 5 MG PO TABS: 5.0000 mg | ORAL_TABLET | ORAL | Status: DC | PRN

## 2021-09-03 MED ORDER — CALCIUM GLUCONATE 10 % IV SOLN
INTRAVENOUS | Status: AC
  Filled 2021-09-03: qty 10

## 2021-09-03 MED ORDER — NALBUPHINE HCL 10 MG/ML IJ SOLN: 5.0000 mg | Freq: Once | INTRAMUSCULAR | Status: DC | PRN

## 2021-09-03 MED ORDER — FERROUS SULFATE 325 (65 FE) MG PO TABS
325.0000 mg | ORAL_TABLET | Freq: Two times a day (BID) | ORAL | Status: DC
  Administered 2021-09-03 – 2021-09-06 (×6): 325 mg via ORAL
  Filled 2021-09-03 (×6): qty 1

## 2021-09-03 MED ORDER — PRENATAL MULTIVITAMIN CH
1.0000 | ORAL_TABLET | Freq: Every day | ORAL | Status: DC
  Administered 2021-09-04 – 2021-09-06 (×3): 1 via ORAL
  Filled 2021-09-03 (×3): qty 1

## 2021-09-03 MED ORDER — IBUPROFEN 600 MG PO TABS
600.0000 mg | ORAL_TABLET | Freq: Four times a day (QID) | ORAL | Status: DC
  Administered 2021-09-04 – 2021-09-05 (×4): 600 mg via ORAL
  Filled 2021-09-03 (×4): qty 1

## 2021-09-03 MED ORDER — ONDANSETRON HCL 4 MG/2ML IJ SOLN: 4.0000 mg | Freq: Three times a day (TID) | INTRAMUSCULAR | Status: DC | PRN

## 2021-09-03 MED ORDER — KETOROLAC TROMETHAMINE 30 MG/ML IJ SOLN: 30.0000 mg | Freq: Four times a day (QID) | INTRAMUSCULAR | Status: DC | PRN

## 2021-09-03 MED ORDER — MAGNESIUM SULFATE 40 GM/1000ML IV SOLN
2.0000 g/h | INTRAVENOUS | Status: DC
  Administered 2021-09-03 (×2): 2 g/h via INTRAVENOUS
  Filled 2021-09-03 (×2): qty 1000

## 2021-09-03 MED ORDER — DEXTROSE 5 % IV SOLN
1.0000 ug/kg/h | INTRAVENOUS | Status: DC | PRN
  Filled 2021-09-03: qty 5

## 2021-09-03 MED ORDER — LABETALOL HCL 5 MG/ML IV SOLN
20.0000 mg | INTRAVENOUS | Status: DC | PRN
  Administered 2021-09-03: 20 mg via INTRAVENOUS
  Filled 2021-09-03: qty 4

## 2021-09-03 MED ORDER — MORPHINE SULFATE (PF) 0.5 MG/ML IJ SOLN
INTRAMUSCULAR | Status: DC | PRN
  Administered 2021-09-03: 100 ug via INTRATHECAL

## 2021-09-03 MED ORDER — MEPERIDINE HCL 25 MG/ML IJ SOLN: 6.2500 mg | INTRAMUSCULAR | Status: DC | PRN

## 2021-09-03 MED ORDER — DROPERIDOL 2.5 MG/ML IJ SOLN
0.6250 mg | Freq: Once | INTRAMUSCULAR | Status: DC | PRN
  Filled 2021-09-03: qty 2

## 2021-09-03 MED ORDER — EPHEDRINE 5 MG/ML INJ
INTRAVENOUS | Status: AC
  Filled 2021-09-03: qty 15

## 2021-09-03 MED ORDER — OXYTOCIN-SODIUM CHLORIDE 30-0.9 UT/500ML-% IV SOLN
INTRAVENOUS | Status: AC
  Filled 2021-09-03: qty 500

## 2021-09-03 MED ORDER — FENTANYL CITRATE (PF) 100 MCG/2ML IJ SOLN: INTRAMUSCULAR | Status: DC | PRN

## 2021-09-03 MED ORDER — SODIUM CHLORIDE 0.9% FLUSH: 3.0000 mL | INTRAVENOUS | Status: DC | PRN

## 2021-09-03 MED ORDER — MAGNESIUM SULFATE BOLUS VIA INFUSION
4.0000 g | Freq: Once | INTRAVENOUS | Status: AC
  Administered 2021-09-03: 4 g via INTRAVENOUS
  Filled 2021-09-03: qty 1000

## 2021-09-03 MED ORDER — PHENYLEPHRINE HCL-NACL 20-0.9 MG/250ML-% IV SOLN
INTRAVENOUS | Status: DC | PRN
  Administered 2021-09-03: 30 ug/min via INTRAVENOUS

## 2021-09-03 MED ORDER — MORPHINE SULFATE (PF) 0.5 MG/ML IJ SOLN
INTRAMUSCULAR | Status: AC
  Filled 2021-09-03: qty 10

## 2021-09-03 MED ORDER — LABETALOL HCL 5 MG/ML IV SOLN
40.0000 mg | INTRAVENOUS | Status: DC | PRN
  Administered 2021-09-03: 40 mg via INTRAVENOUS
  Filled 2021-09-03: qty 8

## 2021-09-03 MED ORDER — ONDANSETRON HCL 4 MG/2ML IJ SOLN
INTRAMUSCULAR | Status: DC | PRN
  Administered 2021-09-03: 4 mg via INTRAVENOUS

## 2021-09-03 MED ORDER — DIPHENHYDRAMINE HCL 50 MG/ML IJ SOLN: 12.5000 mg | INTRAMUSCULAR | Status: DC | PRN

## 2021-09-03 MED ORDER — SIMETHICONE 80 MG PO CHEW
80.0000 mg | CHEWABLE_TABLET | ORAL | Status: DC | PRN
  Filled 2021-09-03: qty 1

## 2021-09-03 MED ORDER — HYDROMORPHONE HCL 1 MG/ML IJ SOLN: 0.2000 mg | INTRAMUSCULAR | Status: DC | PRN

## 2021-09-03 MED ORDER — DIPHENHYDRAMINE HCL 25 MG PO CAPS: 25.0000 mg | ORAL_CAPSULE | Freq: Four times a day (QID) | ORAL | Status: DC | PRN

## 2021-09-03 MED ORDER — HYDROCODONE-ACETAMINOPHEN 7.5-325 MG PO TABS: 1.0000 | ORAL_TABLET | Freq: Once | ORAL | Status: DC | PRN

## 2021-09-03 MED ORDER — FENTANYL CITRATE (PF) 100 MCG/2ML IJ SOLN
INTRAMUSCULAR | Status: DC | PRN
  Administered 2021-09-03 (×2): 50 ug via INTRATHECAL
  Administered 2021-09-03: 15 ug via INTRATHECAL

## 2021-09-03 MED ORDER — SENNOSIDES-DOCUSATE SODIUM 8.6-50 MG PO TABS
2.0000 | ORAL_TABLET | ORAL | Status: DC
  Administered 2021-09-03 – 2021-09-06 (×4): 2 via ORAL
  Filled 2021-09-03 (×4): qty 2

## 2021-09-03 MED ORDER — SIMETHICONE 80 MG PO CHEW
80.0000 mg | CHEWABLE_TABLET | Freq: Three times a day (TID) | ORAL | Status: DC
  Administered 2021-09-03 – 2021-09-06 (×9): 80 mg via ORAL
  Filled 2021-09-03 (×8): qty 1

## 2021-09-03 SURGICAL SUPPLY — 26 items
CHLORAPREP W/TINT 26 (MISCELLANEOUS) ×4 IMPLANT
DERMABOND ADVANCED (GAUZE/BANDAGES/DRESSINGS) ×1
DERMABOND ADVANCED .7 DNX12 (GAUZE/BANDAGES/DRESSINGS) ×1 IMPLANT
DRSG OPSITE POSTOP 4X10 (GAUZE/BANDAGES/DRESSINGS) ×2 IMPLANT
ELECT CAUTERY BLADE 6.4 (BLADE) ×2 IMPLANT
ELECT REM PT RETURN 9FT ADLT (ELECTROSURGICAL) ×2
ELECTRODE REM PT RTRN 9FT ADLT (ELECTROSURGICAL) ×1 IMPLANT
GLOVE SURG UNDER POLY LF SZ6.5 (GLOVE) ×4 IMPLANT
GOWN STRL REUS W/ TWL LRG LVL3 (GOWN DISPOSABLE) ×1 IMPLANT
GOWN STRL REUS W/ TWL XL LVL3 (GOWN DISPOSABLE) ×2 IMPLANT
GOWN STRL REUS W/TWL LRG LVL3 (GOWN DISPOSABLE) ×1
GOWN STRL REUS W/TWL XL LVL3 (GOWN DISPOSABLE) ×2
MANIFOLD NEPTUNE II (INSTRUMENTS) ×2 IMPLANT
MAT PREVALON FULL STRYKER (MISCELLANEOUS) ×2 IMPLANT
NS IRRIG 1000ML POUR BTL (IV SOLUTION) ×2 IMPLANT
PACK C SECTION AR (MISCELLANEOUS) ×2 IMPLANT
PAD OB MATERNITY 4.3X12.25 (PERSONAL CARE ITEMS) ×2 IMPLANT
PAD PREP 24X41 OB/GYN DISP (PERSONAL CARE ITEMS) ×2 IMPLANT
PENCIL SMOKE EVACUATOR (MISCELLANEOUS) ×2 IMPLANT
SCRUB EXIDINE 4% CHG 4OZ (MISCELLANEOUS) ×2 IMPLANT
SUT MNCRL AB 4-0 PS2 18 (SUTURE) ×2 IMPLANT
SUT PLAIN 3-0 (SUTURE) ×2 IMPLANT
SUT VIC AB 0 CT1 36 (SUTURE) ×6 IMPLANT
SUT VIC AB 2-0 CT1 36 (SUTURE) ×2 IMPLANT
SYR 30ML LL (SYRINGE) ×4 IMPLANT
WATER STERILE IRR 500ML POUR (IV SOLUTION) ×2 IMPLANT

## 2021-09-03 SURGICAL SUPPLY — 35 items
CHLORAPREP W/TINT 26 (MISCELLANEOUS) ×4 IMPLANT
DERMABOND ADVANCED (GAUZE/BANDAGES/DRESSINGS) ×1
DERMABOND ADVANCED .7 DNX12 (GAUZE/BANDAGES/DRESSINGS) ×1 IMPLANT
DRESSING SURGICEL FIBRLLR 1X2 (HEMOSTASIS) ×2 IMPLANT
DRSG OPSITE POSTOP 4X10 (GAUZE/BANDAGES/DRESSINGS) ×2 IMPLANT
DRSG SURGICEL FIBRILLAR 1X2 (HEMOSTASIS) ×4
ELECT CAUTERY BLADE 6.4 (BLADE) ×2 IMPLANT
ELECT REM PT RETURN 9FT ADLT (ELECTROSURGICAL) ×2
ELECTRODE REM PT RTRN 9FT ADLT (ELECTROSURGICAL) ×1 IMPLANT
EXTRACTOR VACUUM KIWI (MISCELLANEOUS) ×2 IMPLANT
GLOVE SURG UNDER POLY LF SZ6.5 (GLOVE) ×4 IMPLANT
GOWN STRL REUS W/ TWL LRG LVL3 (GOWN DISPOSABLE) ×1 IMPLANT
GOWN STRL REUS W/ TWL XL LVL3 (GOWN DISPOSABLE) ×2 IMPLANT
GOWN STRL REUS W/TWL LRG LVL3 (GOWN DISPOSABLE) ×1
GOWN STRL REUS W/TWL XL LVL3 (GOWN DISPOSABLE) ×2
MANIFOLD NEPTUNE II (INSTRUMENTS) ×2 IMPLANT
MAT PREVALON FULL STRYKER (MISCELLANEOUS) ×2 IMPLANT
NS IRRIG 1000ML POUR BTL (IV SOLUTION) ×2 IMPLANT
PACK C SECTION AR (MISCELLANEOUS) ×2 IMPLANT
PAD OB MATERNITY 4.3X12.25 (PERSONAL CARE ITEMS) ×2 IMPLANT
PAD PREP 24X41 OB/GYN DISP (PERSONAL CARE ITEMS) ×2 IMPLANT
PENCIL SMOKE EVACUATOR (MISCELLANEOUS) ×2 IMPLANT
RETRACTOR TRAXI PANNICULUS (MISCELLANEOUS) ×1 IMPLANT
RETRACTOR WND ALEXIS-O 25 LRG (MISCELLANEOUS) ×1 IMPLANT
RTRCTR WOUND ALEXIS O 25CM LRG (MISCELLANEOUS) ×2
SCRUB EXIDINE 4% CHG 4OZ (MISCELLANEOUS) ×2 IMPLANT
SEPRAFILM MEMBRANE 5X6 (MISCELLANEOUS) ×2 IMPLANT
SUT MNCRL AB 4-0 PS2 18 (SUTURE) ×2 IMPLANT
SUT PLAIN 3-0 (SUTURE) ×2 IMPLANT
SUT VIC AB 0 CT1 36 (SUTURE) ×6 IMPLANT
SUT VIC AB 2-0 CT1 36 (SUTURE) ×2 IMPLANT
SYR 30ML LL (SYRINGE) ×4 IMPLANT
TAPE MEDIFIX FOAM 3 (GAUZE/BANDAGES/DRESSINGS) ×2 IMPLANT
TRAXI PANNICULUS RETRACTOR (MISCELLANEOUS) ×1
WATER STERILE IRR 500ML POUR (IV SOLUTION) ×2 IMPLANT

## 2021-09-03 NOTE — Discharge Instructions (Signed)
Discharge Instructions:   Follow-up Appointment: 1 week, please call the office to schedule  If there are any new medications, they have been ordered and will be available for pickup at the listed pharmacy on your way home from the hospital.   Call the office if you have any of the following: headache, visual changes, fever >101.0 F, chills, shortness of breath, breast concerns, excessive vaginal bleeding, incision drainage or problems, leg pain or redness, depression or any other concerns. If you have vaginal discharge with an odor, let your doctor know.   It is normal to bleed for up to 6 weeks. You should not soak through more than 1 pad in 1 hour. If you have a blood clot larger than your fist with continued bleeding, call your doctor.   After a c-section, you should expect a small amount of blood or clear fluid coming from the incision and abdominal cramping/soreness. Inspect your incision site daily. Stand in front of a mirror to look for any redness, incision opening, or discolored/odorness drainage. Take a shower daily and continue good hygiene. Use own towel and washcloth (do not share). Make sure your sheets on your bed are clean. No pets sleeping around your incision site. Dressing will be removed at your postpartum visit. If the dressing does become wet or soiled underneath, it is okay to remove it before your visit.    On-Q pump: You will remove on day 4 after insertion or if the ball becomes flat before day 4. You will remove on: November 4th 2022.  Activity: Do not lift > 15 lbs for 6 weeks (do not lift anything heavier than your baby). No intercourse, tampons, swimming pools, hot tubs, baths (only showers) for 6 weeks.  No driving for 1-2 weeks. Do not drive while taking narcotic or opioid pain medication.  Continue taking your prenatal vitamin, especially if breastfeeding. Increase calories and fluids (water) while breastfeeding.   Your milk will come in, in the next couple of  days (right now it is colostrum). You may have a slight fever when your milk comes in, but it should go away on its own.  If it does not, and rises above 101 F please call the doctor. You will also feel achy and your breasts will be firm. They will also start to leak. If you are breastfeeding, continue as you have been and you can pump/express milk for comfort.   If you have too much milk, your breasts can become engorged, which could lead to mastitis. This is an infection of the milk ducts. It can be very painful and you will need to notify your doctor to obtain a prescription for antibiotics. You can also treat it with a shower or hot/cold compress.   For concerns about your baby, please call your pediatrician.  For breastfeeding concerns, the lactation consultant can be reached at [phone].   Postpartum blues (feelings of happy one minute and sad another minute) are normal for the first few weeks but if it gets worse let your doctor know.   Congratulations! We enjoyed caring for you and your new bundle of joy!

## 2021-09-03 NOTE — Op Note (Signed)
Cesarean Section Procedure Note 09/03/21  Pre-operative Diagnosis:  1. Preeclampsia with severe features  2. History or prior cesaren section  Post-operative Diagnosis: same, delivered. 1. Preeclampsia with severe features  2. History or prior cesaren section  3. Dense Adhesions  Procedure: Repeat Low Transverse Cesarean Section, Lysis of Adhesions  Surgeon: Adelene Idler MD    Assistant(s): Heloise Ochoa CNM and Christeen Douglas MD - No other skilled surgical assistant available.  Anesthesia: Spinal  Estimated Blood Loss: 990 cc  Complications: None; patient tolerated the procedure well.   Disposition: PACU - hemodynamically stable.  Condition: stable   Findings: A female infant in the cephalic presentation. Amniotic fluid - clear  Birth weight: 5 lbs 2oz Apgars of 7 and 9.  Intact placenta with a three-vessel cord. Grossly normal uterus, tubes and ovaries bilaterally. Dense intraabdominal adhesions were noted especially between the uterus and the bladder   Procedure Details    The patient was taken to operating room, identified as the correct patient and the procedure verified as C-Section Delivery. A time out was held and the above information confirmed. After induction of anesthesia, the patient was draped and prepped in the usual sterile manner. A Pfannenstiel incision was made and carried down through the subcutaneous tissue to the fascia. Fascial incision was made and extended transversely with the Mayo scissors. The fascia was separated from the underlying rectus tissue superiorly and inferiorly. The peritoneum was identified and entered bluntly. Thick peritoneal adhesions were noted. Peritoneal incision was extended longitudinally. There were significant bladder adhesions the the upper uterus. Care was taken to dissect the bladder with the Metzenbaum scissors, and the bovie.  Dr. Dalbert Garnet was called for assistance during the surgery and assisted with the resection of  the bladder and tissue retractions.   Once the bladder was successfully resected, a Alexis retractor was placed. A low transverse hysterotomy was made. The fetus was delivered atraumatically with assistance of the kiwi vacuum.    The umbilical cord was clamped x 2 and cut and the infant was handed to the awaiting pediatricians. The placenta was removed intact and appeared normal with a 3-vessel cord. The uterus was cleared of all clot and debris.   The hysterotomy was closed with two layers of running sutures of 0 Vicryl suture. A third imbricating layer was placed with the same suture. Excellent hemostasis was observed.   The pelvis was inspected and again, excellent hemostasis was noted. The peritoneum was closed with a running stitch of 2-0 Vicryl. The On Q Pain pump system was then placed.  Trocars were placed through the abdominal wall into the subfascial space and these were used to thread the silver soaker cathaters into place.The rectus muscles were inspected and were hemostatic. The rectus fascia was then reapproximated with running sutures of 0-vicryl, with careful placement not to incorporate the cathaters. Subcutaneous tissues are then irrigated with saline and hemostasis assured with the bovie. The subcutaneous fat was approximated with 3-0 plain and a running stitch.  The skin was closed with 4-0 monocryl suture in a subcuticular fashion followed by skin adhesive. The cathaters are flushed each with 5 mL of Bupivicaine and stabilized into place with dressing. Instrument, sponge, and needle counts were correct prior to the abdominal closure and at the conclusion of the case.  The patient tolerated the procedure well and was transferred to the recovery room in stable condition.   Dr. Dalbert Garnet and Heloise Ochoa assisted with this case. This was a high level case requiring  a Financial controller. No other assistant was readily available. They assisted with retraction of tissue and dissection of the  bladder as well as application of fundal pressure during the case.    Natale Milch MD [facility], [facility] 09/03/21 2:44 AM

## 2021-09-03 NOTE — H&P (Addendum)
Crystal Choi is an 35 y.o. female.   Chief Complaint: Headache, vision changes, Nausea, vomiting, elevated BP at home  HPI: She presented today through the ER with complaints of nausea, headache, and vision changes.  She says that her headache started this morning and has not resolved with Tylenol.  She reports that she is seeing floaters and having vision changes which is new onset this evening.  She had worsening nausea after eating dinner.  She had preeclampsia with her prior pregnancy and reports that her symptoms are similar.  She reports that she was able to take her blood pressure medicines today at home.  She is currently taking labetalol 600 mg twice a day and Procardia 30 XL once a day.  She reports that she has been checking her blood pressures at home.  On Friday she had an elevated blood pressure with a systolic value of 160.  Today her diastolics have been consistently elevated above 110.  She reports that prior to pregnancy she had a diagnosis of chronic hypertension but she had been able to discontinue blood pressure medications after weight loss.  With weight gain related related to pregnancy, she had to start blood pressure medications again early in her pregnancy.   July 2022 Problems (from 05/23/21 to present)     Problem Noted Resolved   Supervision of high risk pregnancy, antepartum 05/23/2021 by Conard Novak, MD No   Overview Addendum 08/22/2021 11:34 AM by Natale Milch, MD     Nursing Staff Provider  Office Location  [facility] Dating  19 wk [place]  Language  English Anatomy US  normal  Flu Vaccine  07/25/2021 Genetic Screen  NIPS: normal xy  TDaP vaccine   07/25/2021 Hgb A1C or  GTT Third trimester : 107  Covid    LAB RESULTS   Rhogam  Not needed Blood Type O/Positive/-- (07/20 0944)   Feeding Plan Breast Antibody Negative (07/20 0944)  Contraception  Rubella 3.00 (07/20 0944)  Circumcision  RPR Non Reactive (07/20 0944)   Pediatrician   HBsAg  Negative (07/20 0944)   Support Person  HIV Non Reactive (07/20 0944)  Prenatal Classes  Varicella  Immune    GBS  (For PCN allergy, check sensitivities)   BTL Consent     VBAC Consent  Pap  02/25/2018- NIL    Hgb Electro  normal    CF      SMA        CHTN History of a cesarean section- desires repeat History of preeclampsia      Obesity affecting pregnancy 05/23/2021 by Conard Novak, MD No   Overview Signed 08/08/2021 11:45 AM by Nadara Mustard, MD           History of cesarean section complicating pregnancy 05/23/2021 by Conard Novak, MD No   Overview Signed 08/08/2021 11:45 AM by Nadara Mustard, MD    Desires Repeat CS      Hx of pre-eclampsia in prior pregnancy, currently pregnant 05/23/2021 by Conard Novak, MD No   Elevated blood pressure affecting pregnancy, antepartum 05/23/2021 by Conard Novak, MD No   Nausea and vomiting during pregnancy 05/23/2021 by Conard Novak, MD No       Past Medical History:  Diagnosis Date   Family history of ovarian cancer 07/2021   cancer genetic testing letter sent   Medical history non-contributory    No known health problems     Past Surgical History:  Procedure  Laterality Date   CESAREAN SECTION     WRIST FRACTURE SURGERY      Family History  Problem Relation Age of Onset   Hypertension Mother    Diabetes Father    Hypertension Father    Hypertension Sister    Hypertension Maternal Grandfather    Ovarian cancer Paternal Grandmother    Social History:  reports that she quit smoking about 5 months ago. Her smoking use included cigarettes. She has never used smokeless tobacco. She reports that she does not currently use alcohol. She reports that she does not use drugs.  Allergies: No Known Allergies  Medications Prior to Admission  Medication Sig Dispense Refill   aspirin EC 81 MG tablet Take 81 mg by mouth daily. Swallow whole.     labetalol (NORMODYNE) 200 MG tablet Take 3 tablets (600 mg  total) by mouth 2 (two) times daily. 180 tablet 3   NIFEdipine (PROCARDIA-XL/NIFEDICAL-XL) 30 MG 24 hr tablet Take 1 tablet (30 mg total) by mouth daily. Can increase to twice a day as needed for symptomatic contractions 30 tablet 2   Prenatal MV & Min w/FA-DHA (PRENATAL GUMMIES PO) Take by mouth.     cyclobenzaprine (FLEXERIL) 10 MG tablet Take 1 tablet (10 mg total) by mouth 3 (three) times daily as needed for muscle spasms. 15 tablet 0    No results found for this or any previous visit (from the past 48 hour(s)). No results found.  Review of Systems  Constitutional:  Negative for chills and fever.  HENT:  Negative for congestion, hearing loss and sinus pain.   Respiratory:  Negative for cough, shortness of breath and wheezing.   Cardiovascular:  Negative for chest pain, palpitations and leg swelling.  Gastrointestinal:  Negative for abdominal pain, constipation, diarrhea, nausea and vomiting.  Genitourinary:  Negative for dysuria, flank pain, frequency, hematuria and urgency.  Musculoskeletal:  Negative for back pain.  Skin:  Negative for rash.  Neurological:  Negative for dizziness and headaches.  Psychiatric/Behavioral:  Negative for suicidal ideas. The patient is not nervous/anxious.    Blood pressure (!) 165/104, pulse 87, temperature 98 F (36.7 C), temperature source Oral, resp. rate 18, height 5\' 5"  (1.651 m), weight 119.7 kg, last menstrual period 01/02/2021, SpO2 100 %. Physical Exam Vitals and nursing note reviewed.  Constitutional:      Appearance: Normal appearance. She is well-developed.  HENT:     Head: Normocephalic and atraumatic.  Cardiovascular:     Rate and Rhythm: Normal rate and regular rhythm.  Pulmonary:     Effort: Pulmonary effort is normal.     Breath sounds: Normal breath sounds.  Abdominal:     General: Bowel sounds are normal.     Palpations: Abdomen is soft.  Musculoskeletal:        General: Normal range of motion.  Skin:    General: Skin is  warm and dry.  Neurological:     Mental Status: She is alert and oriented to person, place, and time.  Psychiatric:        Behavior: Behavior normal.        Thought Content: Thought content normal.        Judgment: Judgment normal.     Assessment/Plan  35 y.o. G3P1011 at [redacted]w[redacted]d with preeclampsia with severe features and history of prior cesarean section who desires a repeat cesarean section.   Will obtain Pre-eclampsia labs Will start IV magnesium given severe features Will move towards delivery by repeat cesarean section  Discussed risks, benefits, and alternatives with the patient for the cesarean section.  Discussed the risk of infection bleeding and damage to surrounding pelvic tissues including but not limited to the uterus, fallopian tubes, ovaries, bowel, and bladder.  Discussed proper care for the incision postoperatively to avoid infection and signs and symptoms of infection to watch for.  Discussed possibility of an emergent blood transfusion for heavy blood loss.  Discussed risks associated with blood transfusion including transfusion reaction and chronic infections, or sepsis which could lead to death.  Patient gave consent for the procedure and blood transfusion if needed. Consent forms were completed.  Discussed risks of prematurity with the patient.  Homero Fellers, MD 09/03/2021, 12:44 AM

## 2021-09-03 NOTE — Lactation Note (Signed)
This note was copied from a baby's chart. Lactation Consultation Note  Patient Name: Crystal Choi [id number] Date: 09/03/2021 Reason for consult: Follow-up assessment Age:34 hours  Third attempt with breastfeeding, 11hrs old. Baby remains sleepy and uninterested, multiple attempts made to latch and interest baby- removal of blanket, hand expression onto mouth, rubbing nose to chin, trying Left breast.   Mom has 62mL EBM available; dad will feed via bottle, mom will pump.  Continue with current feeding practices.  Maternal Data    Feeding Mother's Current Feeding Choice: Breast Milk  LATCH Score Latch: Too sleepy or reluctant, no latch achieved, no sucking elicited.                  Lactation Tools Discussed/Used    Interventions Interventions: Breast feeding basics reviewed;Assisted with latch;Hand express;Adjust position;Support pillows (Left breast this time)  Discharge Pump: Personal  Consult Status Consult Status: Follow-up Date: 09/04/21 Follow-up type: In-patient    Danford Bad 09/03/2021, 5:10 PM

## 2021-09-03 NOTE — Progress Notes (Signed)
Magnesium Check Note  09/03/2021 - 10:31 AM  35 y.o. [id number].  Pregnancy complicated by preeclampsia with severe features.  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 She is doing well at 5 hours post repeat cesarean section. She is tolerating PO intake. Her pain is controlled.   Objective:   Vitals:   09/03/21 0901 09/03/21 0902 09/03/21 0903 09/03/21 0904  BP:   139/90   Pulse: 93 98 95 (!) 105  Resp: (!) 22 (!) 25 (!) 26 (!) 31  Temp:      TempSrc:      SpO2: 99% 98% 100% 95%  Weight:      Height:        Current Vital Signs 24h Vital Sign Ranges  T 97.6 F (36.4 C) Temp  Avg: 97.8 F (36.6 C)  Min: 97.6 F (36.4 C)  Max: 98 F (36.7 C)  BP 139/90 BP  Min: 120/105  Max: 168/117  HR (!) 105  Pulse  Avg: 86.7  Min: 76  Max: 105  RR (!) 31  Resp  Avg: 20.3  Min: 11  Max: 31  SaO2 95 % Room Air SpO2  Avg: 97 %  Min: 88 %  Max: 100 %       24 Hour I/O Current Shift I/O  Time Ins Outs 10/30 0701 - 10/31 0700 In: 1100 [I.V.:1000] Out: 200 [Urine:200] 10/31 0701 - 10/31 1900 In: 1943.8 [P.O.:1200; I.V.:743.8] Out: 1550 [Urine:560]   Physical Exam: General: NAD Pulm: CTAB CV: RRR Abd: Soft, nontender, nondistended, +BS Ext: SCDs in place Neuro: DTRs 2+ brachioradialis  Labs:  Recent Labs  Lab  09/03/21 0033  WBC 12.1*  HGB 12.2  HCT 37.1  PLT 137*   Recent Labs  Lab 09/03/21 0049  NA 135  K 3.7  CL 106  CO2 21*  BUN 8  CREATININE 0.51  CALCIUM 8.5*  PROT 6.7  BILITOT 0.8  ALKPHOS 94  ALT 20  AST 22  GLUCOSE 98    Medications SCHEDULED MEDICATIONS   bupivacaine  10 mL Infiltration Once   bupivacaine       ferrous sulfate  325 mg Oral BID WC   methylene blue       NIFEdipine  60 mg Oral Daily   prenatal multivitamin  1 tablet Oral Q1200   senna-docusate  2 tablet Oral Q24H   simethicone  80 mg Oral TID PC   sodium citrate-citric acid  30 mL Oral 30 min Pre-Op    MEDICATION INFUSIONS   bupivacaine 0.25 % ON-Q pump DUAL CATH 400 mL     lactated ringers 33.3 mL/hr at 09/03/21 1000   lactated ringers     magnesium sulfate 2 g/hr (09/03/21 1000)   naLOXone (NARCAN) adult infusion for PRURITIS     oxytocin 2.5 Units/hr (09/03/21 0910)    PRN MEDICATIONS  diphenhydrAMINE **OR** diphenhydrAMINE, labetalol **  AND** labetalol **AND** labetalol **AND** hydrALAZINE **AND** Measure blood pressure, ketorolac **OR** ketorolac, meperidine (DEMEROL) injection, nalbuphine **OR** nalbuphine, nalbuphine **OR** nalbuphine, naloxone **AND** sodium chloride flush, naLOXone (NARCAN) adult infusion for PRURITIS, ondansetron (ZOFRAN) IV   Assessment & Plan:  35 y.o. [id number] without signs of magnesium toxicity 1) preeclampsia with severe feature, postpartum: continue magnesum 2) plan for discontinuation of magnesium 24 hours from time of delivery.   Tresea Mall, CNM 09/03/2021 10:31 AM  [facility]

## 2021-09-03 NOTE — Anesthesia Procedure Notes (Addendum)
Spinal  Patient location during procedure: OR Start time: 09/03/2021 2:39 AM End time: 09/03/2021 2:44 AM Reason for block: surgical anesthesia Staffing Performed: anesthesiologist  Anesthesiologist: Felicita Gage, MD Preanesthetic Checklist Completed: patient identified, IV checked, site marked, risks and benefits discussed, surgical consent, monitors and equipment checked, pre-op evaluation and timeout performed Spinal Block Patient position: sitting Prep: Betadine and ChloraPrep Patient monitoring: heart rate, continuous pulse ox, blood pressure and cardiac monitor Approach: midline Location: L3-4 Injection technique: single-shot Needle Needle type: Pencan  Needle gauge: 24 G Needle length: 10 cm Assessment Sensory level: T4 Additional Notes Pt tole\rated well

## 2021-09-03 NOTE — Lactation Note (Signed)
This note was copied from a baby's chart. Lactation Consultation Note  Patient Name: Crystal Choi [id number] Date: 09/03/2021 Reason for consult: L&D Initial assessment;Initial assessment;Late-preterm 34-36.6wks;Infant < 6lbs Age:34 hours  Initial lactation visit and feeding support. Mom is P2, first child is 9yrs old- she breast fed for 7 months. Mom delivered via c-section 5hrs prior. Baby is 36wks <6lbs, low temps and was supplemented with higher calorie formula, 38mL.  Baby now has stable blood glucose and temperatures. Baby brought to the breast; football hold with support pillow, mom independent it good position, sandwich hold with everted nipples. Baby did well with lick and learn for 10 minutes; no sustained latch at this feeding attempt.  Transition RN supplemented second feeding with 22 cal formula, 26mL, and mom was set-up with pump.  Mom pumped 64mL of colostrum over 15 minutes. This will be fed at next feeding. LC assisted with bringing baby skin to skin with mom post feeding/pumping. Encouraged to stay skin to skin as long as able; to call out of tired or getting sleepy.  Maternal Data Has patient been taught Hand Expression?: Yes Does the patient have breastfeeding experience prior to this delivery?: Yes How long did the patient breastfeed?: 7 months  Feeding Mother's Current Feeding Choice: Breast Milk Nipple Type: Nfant Extra Slow Flow (gold)  LATCH Score Latch: Repeated attempts needed to sustain latch, nipple held in mouth throughout feeding, stimulation needed to elicit sucking reflex.  Audible Swallowing: None  Type of Nipple: Everted at rest and after stimulation  Comfort (Breast/Nipple): Soft / non-tender  Hold (Positioning): No assistance needed to correctly position infant at breast.  LATCH Score: 7   Lactation Tools Discussed/Used Tools: Bottle  Interventions Interventions: Breast feeding basics reviewed;Assisted with latch;Hand express;Adjust  position;Support pillows;Position options;DEBP  Discharge Pump: Personal  Consult Status Consult Status: Follow-up Date: 09/03/21 Follow-up type: In-patient    Danford Bad 09/03/2021, 11:36 AM

## 2021-09-03 NOTE — Transfer of Care (Signed)
Immediate Anesthesia Transfer of Care Note  Patient: Crystal Choi  Procedure(s) Performed: CESAREAN SECTION  Patient Location: Labor and delivery  Anesthesia Type:Spinal  Level of Consciousness: awake and patient cooperative  Airway & Oxygen Therapy: Patient Spontanous Breathing  Post-op Assessment: Report given to RN and Post -op Vital signs reviewed and stable  Post vital signs: Reviewed and stable  Last Vitals:  Vitals Value Taken Time  BP 140/91 0714 09/03/2021  Temp    Pulse 80   Resp 18   SpO2 100     Last Pain:  Vitals:   09/03/21 0008  TempSrc:   PainSc: 8          Complications: No notable events documented.

## 2021-09-03 NOTE — Anesthesia Preprocedure Evaluation (Addendum)
Anesthesia Evaluation  Patient identified by MRN, date of birth, ID band Patient awake    Reviewed: Allergy & Precautions, NPO status , Patient's Chart, lab work & pertinent test results, reviewed documented beta blocker date and time   History of Anesthesia Complications Negative for: history of anesthetic complications  Airway Mallampati: I  TM Distance: >3 FB Neck ROM: Full    Dental no notable dental hx. (+) Teeth Intact   Pulmonary neg pulmonary ROS, former smoker,    Pulmonary exam normal breath sounds clear to auscultation       Cardiovascular Exercise Tolerance: Good METS: 3 - Mets hypertension (Preeclampsia), Pt. on medications Normal cardiovascular exam Rhythm:Regular Rate:Normal     Neuro/Psych negative neurological ROS  negative psych ROS   GI/Hepatic negative GI ROS, Neg liver ROS,   Endo/Other  negative endocrine ROS  Renal/GU negative Renal ROS Bladder dysfunction      Musculoskeletal negative musculoskeletal ROS (+)   Abdominal   Peds  Hematology negative hematology ROS (+)   Anesthesia Other Findings   Reproductive/Obstetrics (+) Pregnancy Preeclampsia                           Anesthesia Physical Anesthesia Plan  ASA: 3 and emergent  Anesthesia Plan: Spinal   Post-op Pain Management:    Induction: Intravenous  PONV Risk Score and Plan: Ondansetron  Airway Management Planned: Nasal Cannula  Additional Equipment:   Intra-op Plan:   Post-operative Plan: Extubation in OR  Informed Consent: I have reviewed the patients History and Physical, chart, labs and discussed the procedure including the risks, benefits and alternatives for the proposed anesthesia with the patient or authorized representative who has indicated his/her understanding and acceptance.     Dental advisory given  Plan Discussed with: CRNA  Anesthesia Plan Comments: (Preeclampsia- start  2nd IV;  Benefits and risk discussed with patient to include death, MI and CVA.  Pt wishes to proceed.)      Anesthesia Quick Evaluation

## 2021-09-03 NOTE — Lactation Note (Signed)
This note was copied from a baby's chart. Lactation Consultation Note  Patient Name: Crystal Choi Date: 09/03/2021 Reason for consult: Follow-up assessment Age:34 hours  Second at breast attempt for 36wk infant. Baby remained sleepy and hard to latch due to lack of interest. Mom has demonstrated her ability to independently get baby into good position, demonstrates a good sandwich and breast support. Displays knowledge of rubbing nose to chin to encourage a wide mouth and deep latch.   Grandmother supplemented post breastfeeding attempt with 47mL of expressed breast milk/colostrum. Tolerated feeding well, resting soundly. Mom pumped for 15 minutes, obtained 62mL of expressed colostrum. Mom plans to rest now while grandmother remains present.   Will continue current feeding practices.  Maternal Data Has patient been taught Hand Expression?: Yes Does the patient have breastfeeding experience prior to this delivery?: Yes How long did the patient breastfeed?: 7 months  Feeding Mother's Current Feeding Choice: Breast Milk Nipple Type: Nfant Extra Slow Flow (gold)  LATCH Score Latch: Too sleepy or reluctant, no latch achieved, no sucking elicited.  Audible Swallowing: None  Type of Nipple: Everted at rest and after stimulation  Comfort (Breast/Nipple): Soft / non-tender  Hold (Positioning): No assistance needed to correctly position infant at breast.  LATCH Score: 7   Lactation Tools Discussed/Used    Interventions Interventions: Breast feeding basics reviewed;Assisted with latch;Hand express;Support pillows;Pace feeding;Education  Discharge Pump: Personal  Consult Status Consult Status: Follow-up Date: 09/03/21 Follow-up type: Call as needed    Danford Bad 09/03/2021, 2:08 PM

## 2021-09-04 ENCOUNTER — Other Ambulatory Visit: Payer: Self-pay | Admitting: Obstetrics & Gynecology

## 2021-09-04 ENCOUNTER — Other Ambulatory Visit: Payer: Self-pay | Admitting: Obstetrics and Gynecology

## 2021-09-04 DIAGNOSIS — O10919 Unspecified pre-existing hypertension complicating pregnancy, unspecified trimester: Secondary | ICD-10-CM

## 2021-09-04 DIAGNOSIS — O99213 Obesity complicating pregnancy, third trimester: Secondary | ICD-10-CM

## 2021-09-04 LAB — CBC
HCT: 27.7 % — ABNORMAL LOW (ref 36.0–46.0)
Hemoglobin: 9.4 g/dL — ABNORMAL LOW (ref 12.0–15.0)
MCH: 29.3 pg (ref 26.0–34.0)
MCHC: 33.9 g/dL (ref 30.0–36.0)
MCV: 86.3 fL (ref 80.0–100.0)
Platelets: 156 10*3/uL (ref 150–400)
RBC: 3.21 MIL/uL — ABNORMAL LOW (ref 3.87–5.11)
RDW: 14.8 % (ref 11.5–15.5)
WBC: 14.7 10*3/uL — ABNORMAL HIGH (ref 4.0–10.5)
nRBC: 0 % (ref 0.0–0.2)

## 2021-09-04 NOTE — Progress Notes (Signed)
Subjective: Postpartum Day 1: Cesarean Delivery Patient reports tolerating PO, + flatus, and no problems voiding.  She is smiling, shares that she is feeling great after no longer being on the magnesium. Has ambulated several times this morning.  Objective: Vital signs in last 24 hours: Temp:  [97.4 F (36.3 C)-98.5 F (36.9 C)] 98.5 F (36.9 C) (11/01 0812) Pulse Rate:  [80-114] 106 (11/01 0812) Resp:  [16-21] 21 (11/01 0812) BP: (100-155)/(63-106) 140/80 (11/01 0812) SpO2:  [89 %-100 %] 100 % (11/01 0347)  Physical Exam:  General: alert, cooperative, no distress, and moderately obese Lochia: appropriate Uterine Fundus: firm Incision: healing well, scanty amount of red drainage noted- contained behind the tegaderm. DVT Evaluation: No evidence of DVT seen on physical exam. Negative Homan's sign.  Recent Labs    09/03/21 0033 09/04/21 0558  HGB 12.2 9.4*  HCT 37.1 27.7*    Assessment/Plan: Status post Cesarean section. Doing well postoperatively.  Continue current care.Ambulation encouraged.  Mirna Mires 09/04/2021, 10:05 AM

## 2021-09-04 NOTE — Lactation Note (Signed)
This note was copied from a baby's chart. Lactation Consultation Note  Patient Name: Crystal Choi [id number] Date: 09/04/2021 Reason for consult: Mother's request;Late-preterm 34-36.6wks;Infant < 6lbs Age:34 hours  Lactation follow-up. Mom now in room on MBU after completing 24hrs on Mag2+. Feeling better, up and moving around. Baby fed at the breast a couple of times overnight, mom limiting feeding to 10-15 minutes and then providing supplement.   Reviewed feeding plan today, and it was reiterated by Neo during his visit this morning: Feeding every 3 hours, attempting at the breast first, supplementing appropriate volumes of EBM or 22kcal formula, mom pumping for additional stimulation.  Mom has no questions/concerns at this time. Encouraged to continue call for assistance as needed today.  Maternal Data Has patient been taught Hand Expression?: Yes Does the patient have breastfeeding experience prior to this delivery?: Yes How long did the patient breastfeed?: 7 months  Feeding Mother's Current Feeding Choice: Breast Milk  LATCH Score                    Lactation Tools Discussed/Used Tools: Pump;Bottle Breast pump type: Double-Electric Breast Pump Reason for Pumping: 36wk; protocol Pumping frequency: q 3hrs  Interventions Interventions: Breast feeding basics reviewed;Education  Discharge Pump: Personal  Consult Status Consult Status: Follow-up Date: 09/04/21 Follow-up type: Call as needed    Danford Bad 09/04/2021, 9:34 AM

## 2021-09-04 NOTE — Anesthesia Postprocedure Evaluation (Signed)
Anesthesia Post Note  Patient: Crystal Choi  Procedure(s) Performed: CESAREAN SECTION  Patient location during evaluation: Mother Baby Anesthesia Type: Spinal Level of consciousness: oriented and awake and alert Pain management: pain level controlled Vital Signs Assessment: post-procedure vital signs reviewed and stable Respiratory status: spontaneous breathing and respiratory function stable Cardiovascular status: blood pressure returned to baseline and stable Postop Assessment: no headache, no backache, no apparent nausea or vomiting and able to ambulate Anesthetic complications: no   No notable events documented.   Last Vitals:  Vitals:   09/04/21 1155 09/04/21 1536  BP: 128/74 129/82  Pulse: 98 (!) 115  Resp:  18  Temp: 36.7 C 36.9 C  SpO2: 98% 99%    Last Pain:  Vitals:   09/04/21 1536  TempSrc: Oral  PainSc:                  Karoline Caldwell

## 2021-09-04 NOTE — Anesthesia Post-op Follow-up Note (Signed)
  Anesthesia Pain Follow-up Note  Patient: Crystal Choi  Day #: 1  Date of Follow-up: 09/04/2021 Time: 3:53 PM  Last Vitals:  Vitals:   09/04/21 1155 09/04/21 1536  BP: 128/74 129/82  Pulse: 98 (!) 115  Resp:  18  Temp: 36.7 C 36.9 C  SpO2: 98% 99%    Level of Consciousness: alert  Pain: none   Side Effects:None  Catheter Site Exam:clean, dry, no drainage     Plan: D/C from anesthesia care at surgeon's request  Karoline Caldwell

## 2021-09-04 NOTE — Plan of Care (Signed)
  Problem: Education: Goal: Knowledge of condition will improve 09/04/2021 0945 by Lesli Albee, RN Outcome: Progressing 09/04/2021 0945 by Lesli Albee, RN Outcome: Progressing Goal: Individualized Educational Video(s) 09/04/2021 0945 by Lesli Albee, RN Outcome: Progressing 09/04/2021 0945 by Lesli Albee, RN Outcome: Progressing Goal: Individualized Newborn Educational Video(s) 09/04/2021 0945 by Lesli Albee, RN Outcome: Progressing 09/04/2021 0945 by Lesli Albee, RN Outcome: Progressing   Problem: Activity: Goal: Will verbalize the importance of balancing activity with adequate rest periods 09/04/2021 0945 by Lesli Albee, RN Outcome: Progressing 09/04/2021 0945 by Lesli Albee, RN Outcome: Progressing Goal: Ability to tolerate increased activity will improve 09/04/2021 0945 by Lesli Albee, RN Outcome: Progressing 09/04/2021 0945 by Lesli Albee, RN Outcome: Progressing   Problem: Coping: Goal: Ability to identify and utilize available resources and services will improve 09/04/2021 0945 by Lesli Albee, RN Outcome: Progressing 09/04/2021 0945 by Lesli Albee, RN Outcome: Progressing   Problem: Life Cycle: Goal: Chance of risk for complications during the postpartum period will decrease 09/04/2021 0945 by Lesli Albee, RN Outcome: Progressing 09/04/2021 0945 by Lesli Albee, RN Outcome: Progressing   Problem: Role Relationship: Goal: Ability to demonstrate positive interaction with newborn will improve 09/04/2021 0945 by Lesli Albee, RN Outcome: Progressing 09/04/2021 0945 by Lesli Albee, RN Outcome: Progressing   Problem: Skin Integrity: Goal: Demonstration of wound healing without infection will improve 09/04/2021 0945 by Lesli Albee, RN Outcome: Progressing 09/04/2021 0945 by Lesli Albee, RN Outcome: Progressing

## 2021-09-04 NOTE — Plan of Care (Incomplete)

## 2021-09-05 ENCOUNTER — Ambulatory Visit: Payer: Medicaid Other

## 2021-09-05 ENCOUNTER — Encounter: Payer: Medicaid Other | Admitting: Obstetrics and Gynecology

## 2021-09-05 LAB — CBC
HCT: 26.8 % — ABNORMAL LOW (ref 36.0–46.0)
HCT: 27.6 % — ABNORMAL LOW (ref 36.0–46.0)
Hemoglobin: 8.8 g/dL — ABNORMAL LOW (ref 12.0–15.0)
Hemoglobin: 9 g/dL — ABNORMAL LOW (ref 12.0–15.0)
MCH: 28.3 pg (ref 26.0–34.0)
MCH: 28.5 pg (ref 26.0–34.0)
MCHC: 32.8 g/dL (ref 30.0–36.0)
MCHC: 32.6 g/dL (ref 30.0–36.0)
MCV: 86.2 fL (ref 80.0–100.0)
MCV: 87.3 fL (ref 80.0–100.0)
Platelets: 156 10*3/uL (ref 150–400)
Platelets: 183 10*3/uL (ref 150–400)
RBC: 3.11 MIL/uL — ABNORMAL LOW (ref 3.87–5.11)
RBC: 3.16 MIL/uL — ABNORMAL LOW (ref 3.87–5.11)
RDW: 14.9 % (ref 11.5–15.5)
RDW: 14.6 % (ref 11.5–15.5)
WBC: 14.8 10*3/uL — ABNORMAL HIGH (ref 4.0–10.5)
WBC: 14.4 10*3/uL — ABNORMAL HIGH (ref 4.0–10.5)
nRBC: 0 % (ref 0.0–0.2)
nRBC: 0 % (ref 0.0–0.2)

## 2021-09-05 MED ORDER — IBUPROFEN 600 MG PO TABS
600.0000 mg | ORAL_TABLET | Freq: Four times a day (QID) | ORAL | Status: DC
  Administered 2021-09-05 (×3): 600 mg via ORAL
  Filled 2021-09-05 (×5): qty 1

## 2021-09-05 MED ORDER — NIFEDIPINE ER OSMOTIC RELEASE 30 MG PO TB24
90.0000 mg | ORAL_TABLET | Freq: Every day | ORAL | Status: DC
  Administered 2021-09-06: 90 mg via ORAL
  Filled 2021-09-05: qty 3

## 2021-09-05 NOTE — Progress Notes (Signed)
Subjective: Postpartum Day 2: Cesarean Delivery Patient reports tolerating PO, + flatus, and no problems voiding.  Her pain is well controlled wit oral medication. She is ambulating well. Receiving assistance from lactation.  Objective: Vital signs in last 24 hours: Temp:  [98 F (36.7 C)-98.6 F (37 C)] 98.4 F (36.9 C) (11/02 0807) Pulse Rate:  [98-116] 102 (11/02 0807) Resp:  [18-20] 20 (11/02 0807) BP: (128-172)/(74-95) 133/85 (11/02 0831) SpO2:  [98 %-100 %] 100 % (11/02 0807)  Physical Exam:  General: alert, cooperative, no distress, and morbidly obese Lochia: appropriate Uterine Fundus: firm Incision: healing well, small amount of bloody drainage seen from her On Q site. DVT Evaluation: No evidence of DVT seen on physical exam. Negative Homan's sign.  Recent Labs    09/04/21 0558 09/05/21 0420  HGB 9.4* 8.8*  HCT 27.7* 26.8*    Assessment/Plan: Status post Cesarean section. Postoperative course complicated by hypertension and higher blood loss after more complicated Cesarean section.   Continue current care Anticipate discharge tomorrow. CBC in AM.  Mirna Mires 09/05/2021, 10:43 AM

## 2021-09-05 NOTE — Lactation Note (Signed)
This note was copied from a baby's chart. Lactation Consultation Note  Patient Name: Crystal Choi Date: 09/05/2021   Age:34 hours  Lactation follow-up. Family plans to spend one more night due to infants weight loss (8%). Discussed with mom increasing volumes of supplement based on baby's age, and his eagerness at the breast. Goal: 25mL supplement/feeding to maintain and encourage weight gain. Continue to put baby to breast/attempt BF, supplement, pump and have EBM available. Mom on board, no concerns/questions.    Danford Bad 09/05/2021, 1:02 PM

## 2021-09-06 MED ORDER — LABETALOL HCL 200 MG PO TABS: 400.0000 mg | ORAL_TABLET | Freq: Two times a day (BID) | ORAL | 3 refills | Status: DC

## 2021-09-06 MED ORDER — IBUPROFEN 600 MG PO TABS
600.0000 mg | ORAL_TABLET | Freq: Four times a day (QID) | ORAL | Status: DC
  Administered 2021-09-06: 600 mg via ORAL
  Filled 2021-09-06: qty 1

## 2021-09-06 MED ORDER — OXYCODONE-ACETAMINOPHEN 5-325 MG PO TABS: 1.0000 | ORAL_TABLET | ORAL | 0 refills | Status: DC | PRN

## 2021-09-06 MED ORDER — NIFEDIPINE ER OSMOTIC RELEASE 90 MG PO TB24: 90.0000 mg | ORAL_TABLET | Freq: Every day | ORAL | 2 refills | Status: AC

## 2021-09-06 MED ORDER — LABETALOL HCL 200 MG PO TABS
300.0000 mg | ORAL_TABLET | Freq: Two times a day (BID) | ORAL | Status: DC
  Administered 2021-09-06: 300 mg via ORAL
  Filled 2021-09-06: qty 1

## 2021-09-06 NOTE — Progress Notes (Signed)
Pt. discharged home in stable condition. Maternal and child discharge instructions and follow-up care reviewed-pt.verbalized understanding. Pt. discharged home in private vehicle with support person. Pt. stable at discharge. S Summary       - pelvic rest for 6 weeks after delivery (no tampons, douches, sexual intercourse) until cleared by provider.        -drink plenty of water       -call office with any questions

## 2021-09-06 NOTE — Progress Notes (Signed)
Obstetric Postpartum/PostOperative Daily Progress Note Subjective:  35 y.o. [id number] post-operative day # 3 status post repeat cesarean delivery.  She is ambulating, is tolerating po, is voiding spontaneously.  Her pain is well controlled on PO pain medications and On Q pump. Her lochia is less than menses. She reports breastfeeding is going well.    Medications SCHEDULED MEDICATIONS   ferrous sulfate  325 mg Oral BID WC   ibuprofen  600 mg Oral Q6H   labetalol  300 mg Oral BID   NIFEdipine  90 mg Oral Daily   prenatal multivitamin  1 tablet Oral Q1200   senna-docusate  2 tablet Oral Q24H   simethicone  80 mg Oral TID PC    MEDICATION INFUSIONS   lactated ringers Stopped (09/04/21 0710)   lactated ringers     naLOXone (NARCAN) adult infusion for PRURITIS      PRN MEDICATIONS  bisacodyl, coconut oil, witch hazel-glycerin **AND** dibucaine, diphenhydrAMINE, labetalol **AND** labetalol **AND** labetalol **AND** hydrALAZINE **AND** Measure blood pressure, menthol-cetylpyridinium, nalbuphine **OR** nalbuphine, naloxone **AND** sodium chloride flush, naLOXone (NARCAN) adult infusion for PRURITIS, ondansetron (ZOFRAN) IV, oxyCODONE, simethicone, sodium phosphate    Objective:   Vitals:   09/05/21 2329 09/06/21 0321 09/06/21 0807 09/06/21 0822  BP: (!) 148/96 (!) 142/78 (!) 156/97 (!) 148/104  Pulse: (!) 103 (!) 107 (!) 109   Resp: 18 18 (!) 21   Temp: 98.4 F (36.9 C) 97.9 F (36.6 C) 98.2 F (36.8 C)   TempSrc: Oral Oral Oral   SpO2: 100% 100% 100%   Weight:      Height:        Current Vital Signs 24h Vital Sign Ranges  T 98.2 F (36.8 C) Temp  Avg: 98.3 F (36.8 C)  Min: 97.9 F (36.6 C)  Max: 98.6 F (37 C)  BP (!) 148/104 (recheck)  BP  Min: 139/84  Max: 156/97  HR (!) 109  Pulse  Avg: 114.3  Min: 103  Max: 138  RR (!) 21  Resp  Avg: 18.8  Min: 18  Max: 21  SaO2 100 % Room Air SpO2  Avg: 99.8 %  Min: 99 %  Max: 100 %       24 Hour I/O Current Shift I/O   Time Ins Outs No intake/output data recorded. No intake/output data recorded.  General: NAD Pulmonary: no increased work of breathing Abdomen: non-distended, non-tender, fundus firm at level of umbilicus Inc: Clean/dry/intact, On Q intact Extremities: no edema, no erythema, no tenderness  Labs:  Recent Labs  Lab 09/04/21 0558 09/05/21 0420 09/05/21 2015  WBC 14.7* 14.8* 14.4*  HGB 9.4* 8.8* 9.0*  HCT 27.7* 26.8* 27.6*  PLT 156 156 183     Assessment:   35 y.o. [id number] postoperative day # 3 status post repeat cesarean section  Plan:  1) Acute blood loss anemia - hemodynamically stable and asymptomatic - po ferrous sulfate  2) O POS / Rubella 3.00 (07/20 0944)/ Varicella Immune  3) TDAP status given antepartum  4) Breastfeeding/Contraception = IUD  5) Disposition: continue current care, discharge pending BP med adjustment   Tresea Mall, CNM 09/06/2021 10:33 AM

## 2021-09-06 NOTE — Discharge Summary (Signed)
Postpartum Discharge Summary  Date of Service updated11/3/22     Patient Name: Crystal Choi DOB: 06/12/1986 MRN: [id number]  Date of admission: 09/02/2021 Delivery date:09/03/2021  Delivering provider: Adrian Prows R  Date of discharge: 09/06/2021  Admitting diagnosis: Preeclampsia [O14.90] Intrauterine pregnancy: [redacted]w[redacted]d    Secondary diagnosis:  Principal Problem:   Supervision of high risk pregnancy, antepartum Active Problems:   Preeclampsia   Postpartum care following cesarean delivery  Additional problems: None    Discharge diagnosis: Preterm Pregnancy Delivered and Preeclampsia (severe)                                              Post partum procedures: none Augmentation: N/A Complications: None  Hospital course: Onset of Labor With Unplanned C/S   35y.o. yo [id number] admitted on 09/02/2021. Patient had a labor course significant for noted preeclampsia. The patient went for cesarean section due to  preeclampsia and prior CS history . Delivery details as follows: Membrane Rupture Time/Date:  ,   Delivery Method:C-Section, Low Transverse  Details of operation can be found in separate operative note. Patient had an uncomplicated postpartum course.  She is ambulating,tolerating a regular diet, passing flatus, and urinating well.  Patient is discharged home in stable condition 09/06/21.  Newborn Data: Birth date:09/03/2021  Birth time:5:20 AM  Gender:Female  Living status:Living  Apgars:7 ,9  Weight:2350 g   Magnesium Sulfate received: Yes: Seizure prophylaxis BMZ received: No Rhophylac:No MMR:No T-DaP:Given prenatally Flu: N/A Transfusion:No  Physical exam  Vitals:   09/06/21 0807 09/06/21 0822 09/06/21 1045 09/06/21 1128  BP: (!) 156/97 (!) 148/104 (!) 140/92 113/87  Pulse: (!) 109  (!) 108 95  Resp: (!) 21   20  Temp: 98.2 F (36.8 C)     TempSrc: Oral     SpO2: 100%   100%  Weight:      Height:       General: alert,  cooperative, and no distress Lochia: appropriate Uterine Fundus: firm Incision: Healing well with no significant drainage DVT Evaluation: No evidence of DVT seen on physical exam. Labs: Lab Results  Component Value Date   WBC 14.4 (H) 09/05/2021   HGB 9.0 (L) 09/05/2021   HCT 27.6 (L) 09/05/2021   MCV 87.3 09/05/2021   PLT 183 09/05/2021   CMP Latest Ref Rng & Units 09/03/2021  Glucose 70 - 99 mg/dL 98  BUN 6 - 20 mg/dL 8  Creatinine 0.44 - 1.00 mg/dL 0.51  Sodium 135 - 145 mmol/L 135  Potassium 3.5 - 5.1 mmol/L 3.7  Chloride 98 - 111 mmol/L 106  CO2 22 - 32 mmol/L 21(L)  Calcium 8.9 - 10.3 mg/dL 8.5(L)  Total Protein 6.5 - 8.1 g/dL 6.7  Total Bilirubin 0.3 - 1.2 mg/dL 0.8  Alkaline Phos 38 - 126 U/L 94  AST 15 - 41 U/L 22  ALT 0 - 44 U/L 20   Edinburgh Score: Edinburgh Postnatal Depression Scale Screening Tool 09/04/2021  I have been able to laugh and see the funny side of things. 0  I have looked forward with enjoyment to things. 0  I have blamed myself unnecessarily when things went wrong. 0  I have been anxious or worried for no good reason. 0  I have felt scared or panicky for no good reason. 0  Things have been getting  on top of me. 0  I have been so unhappy that I have had difficulty sleeping. 0  I have felt sad or miserable. 0  I have been so unhappy that I have been crying. 0  The thought of harming myself has occurred to me. 0  Edinburgh Postnatal Depression Scale Total 0      After visit meds:  Allergies as of 09/06/2021   No Known Allergies      Medication List     TAKE these medications    cyclobenzaprine 10 MG tablet Commonly known as: FLEXERIL Take 1 tablet (10 mg total) by mouth 3 (three) times daily as needed for muscle spasms.   labetalol 200 MG tablet Commonly known as: NORMODYNE Take 2 tablets (400 mg total) by mouth 2 (two) times daily.   NIFEdipine 90 MG 24 hr tablet Commonly known as: PROCARDIA XL/NIFEDICAL-XL Take 1 tablet (90  mg total) by mouth daily. Start taking on: September 07, 2021   oxyCODONE-acetaminophen 5-325 MG tablet Commonly known as: Percocet Take 1 tablet by mouth every 4 (four) hours as needed for moderate pain or severe pain.   PRENATAL GUMMIES PO Take by mouth.               Discharge Care Instructions  (From admission, onward)           Start     Ordered   09/06/21 0000  If the dressing is still on your incision site when you go home, remove it on the third day after your surgery date. Remove dressing if it begins to fall off, or if it is dirty or damaged before the third day.        09/06/21 1510             Discharge home in stable condition Infant Feeding: Bottle Infant Disposition:home with mother Discharge instruction: per After Visit Summary and Postpartum booklet. Activity: Advance as tolerated. Pelvic rest for 6 weeks.  Diet: routine diet Anticipated Birth Control: IUD Postpartum Appointment:6 weeks Additional Postpartum F/U: Incision check 1 week Future Appointments: Future Appointments  Date Time Provider Grainger  09/10/2021  9:00 AM [facility] PAT2 [facility] None  09/17/2021  8:10 AM [facility] None   Follow up Visit:  Follow-up Information     Schuman, Christanna R, MD Follow up in 1 week(s).   Specialty: Obstetrics and Gynecology Contact information: [address] [place] [address] [phone]                     09/06/2021 Hoyt Koch, MD

## 2021-09-10 ENCOUNTER — Other Ambulatory Visit: Payer: Self-pay

## 2021-09-10 ENCOUNTER — Other Ambulatory Visit: Admission: RE | Admit: 2021-09-10 | Payer: Medicaid Other | Source: Ambulatory Visit

## 2021-09-10 ENCOUNTER — Ambulatory Visit (INDEPENDENT_AMBULATORY_CARE_PROVIDER_SITE_OTHER): Payer: Medicaid Other | Admitting: Obstetrics and Gynecology

## 2021-09-10 ENCOUNTER — Encounter: Payer: Self-pay | Admitting: Obstetrics and Gynecology

## 2021-09-10 DIAGNOSIS — O8600 Infection of obstetric surgical wound, unspecified: Secondary | ICD-10-CM

## 2021-09-10 MED ORDER — SULFAMETHOXAZOLE-TRIMETHOPRIM 800-160 MG PO TABS: 1.0000 | ORAL_TABLET | Freq: Two times a day (BID) | ORAL | 0 refills | Status: AC

## 2021-09-10 NOTE — Progress Notes (Signed)
Postpartum Visit  Chief Complaint:  Chief Complaint  Patient presents with   Postpartum Care    History of Present Illness: Patient is a 35 y.o. [id number] presents for postpartum visit.  Date of delivery: 09/03/2021 Type of delivery: Cesarean section Laceration: none  Pregnancy or labor problems: dense adhesion  Breast Feeding:  yes Lochia:  normal Post partum depression/anxiety noted:  no Edinburgh Post-Partum Depression Score: 1  Date of last PAP: 07/11/2021  normal   Any problems since the delivery:  none3  She reports she has been feeling well, pain is controlled  Newborn Details:  SINGLETON :  1. Baby Gender:female.  Infant Status: Infant doing well at home with mother.   Review of Systems: Review of Systems  Constitutional:  Negative for chills, fever, malaise/fatigue and weight loss.  HENT:  Negative for congestion, hearing loss and sinus pain.   Eyes:  Negative for blurred vision and double vision.  Respiratory:  Negative for cough, sputum production, shortness of breath and wheezing.   Cardiovascular:  Negative for chest pain, palpitations, orthopnea and leg swelling.  Gastrointestinal:  Negative for abdominal pain, constipation, diarrhea, nausea and vomiting.  Genitourinary:  Negative for dysuria, flank pain, frequency, hematuria and urgency.  Musculoskeletal:  Negative for back pain, falls and joint pain.  Skin:  Negative for itching and rash.  Neurological:  Negative for dizziness and headaches.  Psychiatric/Behavioral:  Negative for depression, substance abuse and suicidal ideas. The patient is not nervous/anxious.    Past Medical History:  Past Medical History:  Diagnosis Date   Family history of ovarian cancer 07/2021   cancer genetic testing letter sent   Medical history non-contributory    No known health problems     Past Surgical History:  Past Surgical History:  Procedure Laterality Date   CESAREAN SECTION     CESAREAN SECTION  09/03/2021    Procedure: CESAREAN SECTION;  Surgeon: Natale Milch, MD;  Location: [facility] ORS;  Service: Obstetrics;;   WRIST FRACTURE SURGERY      Family History:  Family History  Problem Relation Age of Onset   Hypertension Mother    Diabetes Father    Hypertension Father    Hypertension Sister    Hypertension Maternal Grandfather    Ovarian cancer Paternal Grandmother     Social History:  Social History   Socioeconomic History   Marital status: Single    Spouse name: Not on file   Number of children: Not on file   Years of education: Not on file   Highest education level: Not on file  Occupational History   Not on file  Tobacco Use   Smoking status: Former    Types: Cigarettes    Quit date: 03/07/2021    Years since quitting: 0.5   Smokeless tobacco: Never  Vaping Use   Vaping Use: Never used  Substance and Sexual Activity   Alcohol use: Not Currently    Comment: a little bit   Drug use: No   Sexual activity: Yes    Birth control/protection: None  Other Topics Concern   Not on file  Social History Narrative   Not on file   Social Determinants of Health   Financial Resource Strain: Not on file  Food Insecurity: Not on file  Transportation Needs: Not on file  Physical Activity: Not on file  Stress: Not on file  Social Connections: Not on file  Intimate Partner Violence: Not on file    Allergies:  No Known  Allergies  Medications: Prior to Admission medications   Medication Sig Start Date End Date Taking? Authorizing Provider  labetalol (NORMODYNE) 200 MG tablet Take 2 tablets (400 mg total) by mouth 2 (two) times daily. 09/06/21  Yes Gae Dry, MD  NIFEdipine (PROCARDIA XL/NIFEDICAL-XL) 90 MG 24 hr tablet Take 1 tablet (90 mg total) by mouth daily. 09/07/21  Yes Gae Dry, MD  Prenatal MV & Min w/FA-DHA (PRENATAL GUMMIES PO) Take by mouth.   Yes [provider]  cyclobenzaprine (FLEXERIL) 10 MG tablet Take 1 tablet (10 mg total) by mouth 3  (three) times daily as needed for muscle spasms. Patient not taking: Reported on 09/10/2021 08/27/21   Imagene Riches, CNM  oxyCODONE-acetaminophen (PERCOCET) 5-325 MG tablet Take 1 tablet by mouth every 4 (four) hours as needed for moderate pain or severe pain. Patient not taking: Reported on 09/10/2021 09/06/21   Gae Dry, MD    Physical Exam Vitals:  Vitals:   09/10/21 1325  BP: 128/72    Physical Exam Constitutional:      Appearance: Normal appearance. She is well-developed.  HENT:     Head: Normocephalic and atraumatic.  Eyes:     Extraocular Movements: Extraocular movements intact.     Pupils: Pupils are equal, round, and reactive to light.  Neck:     Thyroid: No thyromegaly.  Cardiovascular:     Rate and Rhythm: Normal rate and regular rhythm.     Heart sounds: Normal heart sounds.  Pulmonary:     Effort: Pulmonary effort is normal.     Breath sounds: Normal breath sounds.  Abdominal:     General: Bowel sounds are normal. There is no distension.     Palpations: Abdomen is soft. There is no mass.     Comments: Small purulent material at right aspect of incision. No erythema, no drainage, skin intact.   Wet bandage removed from incision.   Musculoskeletal:     Cervical back: Neck supple.  Neurological:     Mental Status: She is alert and oriented to person, place, and time.  Skin:    General: Skin is warm and dry.  Psychiatric:        Behavior: Behavior normal.        Thought Content: Thought content normal.        Judgment: Judgment normal.  Vitals reviewed.    Assessment: 35 y.o. [id number] presenting for 6 week postpartum visit  Plan: Problem List Items Addressed This Visit       Other   Postpartum care following cesarean delivery   Other Visit Diagnoses     Wound infection following cesarean section, postpartum    -  Primary   Relevant Medications   sulfamethoxazole-trimethoprim (BACTRIM DS) 800-160 MG tablet       1) Contraception-  not  discussed  2)  Pap: up to date  3) Patient underwent screening for postpartum depression with no concerns noted.  4) Will start bactrim for wound infection, discussed cleaning, follow up in 1 week.   5) Continue BP medications  - Follow up in 1 week   Adrian Prows MD, [facility] 09/10/2021 1:44 PM

## 2021-09-11 ENCOUNTER — Encounter: Payer: Medicaid Other | Admitting: Obstetrics & Gynecology

## 2021-09-17 ENCOUNTER — Other Ambulatory Visit: Payer: Medicaid Other

## 2021-09-18 ENCOUNTER — Ambulatory Visit: Payer: Medicaid Other | Admitting: Obstetrics and Gynecology

## 2021-09-18 ENCOUNTER — Inpatient Hospital Stay: Admit: 2021-09-18 | Payer: Medicaid Other | Admitting: Obstetrics & Gynecology

## 2021-09-24 ENCOUNTER — Other Ambulatory Visit: Payer: Self-pay

## 2021-09-24 ENCOUNTER — Encounter: Payer: Self-pay | Admitting: Obstetrics and Gynecology

## 2021-09-24 ENCOUNTER — Ambulatory Visit (INDEPENDENT_AMBULATORY_CARE_PROVIDER_SITE_OTHER): Payer: Medicaid Other | Admitting: Obstetrics and Gynecology

## 2021-09-24 ENCOUNTER — Telehealth: Payer: Self-pay

## 2021-09-24 VITALS — BP 118/70 | Ht 65.0 in | Wt 240.0 lb

## 2021-09-24 DIAGNOSIS — O34219 Maternal care for unspecified type scar from previous cesarean delivery: Secondary | ICD-10-CM

## 2021-09-24 NOTE — Progress Notes (Signed)
Postpartum Visit  Chief Complaint:  Chief Complaint  Patient presents with   Routine Prenatal Visit    History of Present Illness: Patient is a 35 y.o. [id number] presents for postpartum visit.  Date of delivery: 09/03/2021 Type of delivery: Cesarean Section Laceration: none  Pregnancy or labor problems: dense adhesions, elevated BP  Breast Feeding:  yes Lochia:  normal Post partum depression/anxiety noted:  no Edinburgh Post-Partum Depression Score: 0  Date of last PAP: 07/11/2021 NIL   Any problems since the delivery:  none  She reports she has been cleaning the wound as instructed. She completed her antibiotics  Newborn Details:  SINGLETON :  1. Baby Gender:female.  Infant Status: Infant doing well at home with mother.   Review of Systems: Review of Systems  Constitutional:  Negative for chills, fever, malaise/fatigue and weight loss.  HENT:  Negative for congestion, hearing loss and sinus pain.   Eyes:  Negative for blurred vision and double vision.  Respiratory:  Negative for cough, sputum production, shortness of breath and wheezing.   Cardiovascular:  Negative for chest pain, palpitations, orthopnea and leg swelling.  Gastrointestinal:  Negative for abdominal pain, constipation, diarrhea, nausea and vomiting.  Genitourinary:  Negative for dysuria, flank pain, frequency, hematuria and urgency.  Musculoskeletal:  Negative for back pain, falls and joint pain.  Skin:  Negative for itching and rash.  Neurological:  Negative for dizziness and headaches.  Psychiatric/Behavioral:  Negative for depression, substance abuse and suicidal ideas. The patient is not nervous/anxious.    Past Medical History:  Past Medical History:  Diagnosis Date   Family history of ovarian cancer 07/2021   cancer genetic testing letter sent   Medical history non-contributory    No known health problems     Past Surgical History:  Past Surgical History:  Procedure Laterality Date   CESAREAN  SECTION     CESAREAN SECTION  09/03/2021   Procedure: CESAREAN SECTION;  Surgeon: Homero Fellers, MD;  Location: [facility] ORS;  Service: Obstetrics;;   WRIST FRACTURE SURGERY      Family History:  Family History  Problem Relation Age of Onset   Hypertension Mother    Diabetes Father    Hypertension Father    Hypertension Sister    Hypertension Maternal Grandfather    Ovarian cancer Paternal Grandmother     Social History:  Social History   Socioeconomic History   Marital status: Single    Spouse name: Not on file   Number of children: Not on file   Years of education: Not on file   Highest education level: Not on file  Occupational History   Not on file  Tobacco Use   Smoking status: Former    Types: Cigarettes    Quit date: 03/07/2021    Years since quitting: 0.5   Smokeless tobacco: Never  Vaping Use   Vaping Use: Never used  Substance and Sexual Activity   Alcohol use: Not Currently    Comment: a little bit   Drug use: No   Sexual activity: Yes    Birth control/protection: None  Other Topics Concern   Not on file  Social History Narrative   Not on file   Social Determinants of Health   Financial Resource Strain: Not on file  Food Insecurity: Not on file  Transportation Needs: Not on file  Physical Activity: Not on file  Stress: Not on file  Social Connections: Not on file  Intimate Partner Violence: Not on file  Allergies:  No Known Allergies  Medications: Prior to Admission medications   Medication Sig Start Date End Date Taking? Authorizing Provider  labetalol (NORMODYNE) 200 MG tablet Take 2 tablets (400 mg total) by mouth 2 (two) times daily. 09/06/21  Yes Nadara Mustard, MD  NIFEdipine (PROCARDIA XL/NIFEDICAL-XL) 90 MG 24 hr tablet Take 1 tablet (90 mg total) by mouth daily. 09/07/21  Yes Nadara Mustard, MD  Prenatal MV & Min w/FA-DHA (PRENATAL GUMMIES PO) Take by mouth.   Yes [provider]  cyclobenzaprine (FLEXERIL) 10 MG  tablet Take 1 tablet (10 mg total) by mouth 3 (three) times daily as needed for muscle spasms. Patient not taking: Reported on 09/10/2021 08/27/21   Mirna Mires, CNM  oxyCODONE-acetaminophen (PERCOCET) 5-325 MG tablet Take 1 tablet by mouth every 4 (four) hours as needed for moderate pain or severe pain. Patient not taking: Reported on 09/10/2021 09/06/21   Nadara Mustard, MD    Physical Exam Vitals:  Vitals:   09/24/21 1328  BP: 118/70    Physical Exam Constitutional:      Appearance: Normal appearance. She is well-developed.  HENT:     Head: Normocephalic and atraumatic.  Eyes:     Extraocular Movements: Extraocular movements intact.     Pupils: Pupils are equal, round, and reactive to light.  Neck:     Thyroid: No thyromegaly.  Pulmonary:     Effort: Pulmonary effort is normal.  Abdominal:     General: Abdomen is flat. Bowel sounds are normal. There is no distension.     Palpations: Abdomen is soft. There is no mass.     Comments: Incision is clean, dry, intact  Neurological:     Mental Status: She is alert and oriented to person, place, and time.  Skin:    General: Skin is warm and dry.  Psychiatric:        Behavior: Behavior normal.        Thought Content: Thought content normal.        Judgment: Judgment normal.  Vitals reviewed.    Assessment: 35 y.o. [id number] presenting for 6 week postpartum visit  Plan: Problem List Items Addressed This Visit       Other   History of cesarean section complicating pregnancy - Primary   Postpartum care following cesarean delivery    1) Contraception-  not discussed  2)  Pap: up to date  3) Patient underwent screening for postpartum depression with  no concerns noted.  4) Incisions is healing well  5) Reports normal BP at home systolic 120s, having occasional headaches, taking procardia and labetalol. Discontinue labetalol, continue procardia 90 mg.     - Follow up in 4 weeks.    Adelene Idler MD,  [facility], [facility] 09/24/2021 2:46 PM

## 2021-09-24 NOTE — Telephone Encounter (Signed)
Patenit is scheduled for 10/22/21 at 3:30 pm with CRS for postpartum and paraguard placement

## 2021-09-25 NOTE — Telephone Encounter (Signed)
Noted. Will order to arrive by apt date/time. 

## 2021-10-22 ENCOUNTER — Ambulatory Visit (INDEPENDENT_AMBULATORY_CARE_PROVIDER_SITE_OTHER): Payer: Medicaid Other | Admitting: Obstetrics and Gynecology

## 2021-10-22 ENCOUNTER — Encounter: Payer: Self-pay | Admitting: Obstetrics and Gynecology

## 2021-10-22 ENCOUNTER — Other Ambulatory Visit: Payer: Self-pay

## 2021-10-22 DIAGNOSIS — Z30430 Encounter for insertion of intrauterine contraceptive device: Secondary | ICD-10-CM | POA: Diagnosis not present

## 2021-10-22 LAB — POCT URINE PREGNANCY: Preg Test, Ur: NEGATIVE

## 2021-10-22 NOTE — Progress Notes (Signed)
Postpartum Visit  Chief Complaint:  Chief Complaint  Patient presents with   Postpartum Care    History of Present Illness: Patient is a 35 y.o. [id number] presents for postpartum visit.  Date of delivery: 09/03/2021 Type of delivery: cesarean section Pregnancy or labor problems: dense adhesions, elevated BP  Breast Feeding:  yes Lochia:  normal Post partum depression/anxiety noted:  no Edinburgh Post-Partum Depression Score: 0  Date of last PAP: 07/11/2021 NIL  Any problems since the delivery:  none  She reports she has been feeling well  Newborn Details:  SINGLETON :  1. Baby Gender:female.  Infant Status: Infant doing well at home with mother.   Review of Systems: Review of Systems  Constitutional:  Negative for chills, fever, malaise/fatigue and weight loss.  HENT:  Negative for congestion, hearing loss and sinus pain.   Eyes:  Negative for blurred vision and double vision.  Respiratory:  Negative for cough, sputum production, shortness of breath and wheezing.   Cardiovascular:  Negative for chest pain, palpitations, orthopnea and leg swelling.  Gastrointestinal:  Negative for abdominal pain, constipation, diarrhea, nausea and vomiting.  Genitourinary:  Negative for dysuria, flank pain, frequency, hematuria and urgency.  Musculoskeletal:  Negative for back pain, falls and joint pain.  Skin:  Negative for itching and rash.  Neurological:  Negative for dizziness and headaches.  Psychiatric/Behavioral:  Negative for depression, substance abuse and suicidal ideas. The patient is not nervous/anxious.    Past Medical History:  Past Medical History:  Diagnosis Date   Family history of ovarian cancer 07/2021   cancer genetic testing letter sent   Medical history non-contributory    No known health problems     Past Surgical History:  Past Surgical History:  Procedure Laterality Date   CESAREAN SECTION     CESAREAN SECTION  09/03/2021   Procedure: CESAREAN SECTION;   Surgeon: Natale Milch, MD;  Location: [facility] ORS;  Service: Obstetrics;;   WRIST FRACTURE SURGERY      Family History:  Family History  Problem Relation Age of Onset   Hypertension Mother    Diabetes Father    Hypertension Father    Hypertension Sister    Hypertension Maternal Grandfather    Ovarian cancer Paternal Grandmother     Social History:  Social History   Socioeconomic History   Marital status: Single    Spouse name: Not on file   Number of children: Not on file   Years of education: Not on file   Highest education level: Not on file  Occupational History   Not on file  Tobacco Use   Smoking status: Former    Types: Cigarettes    Quit date: 03/07/2021    Years since quitting: 0.6   Smokeless tobacco: Never  Vaping Use   Vaping Use: Never used  Substance and Sexual Activity   Alcohol use: Not Currently    Comment: a little bit   Drug use: No   Sexual activity: Yes    Birth control/protection: None  Other Topics Concern   Not on file  Social History Narrative   Not on file   Social Determinants of Health   Financial Resource Strain: Not on file  Food Insecurity: Not on file  Transportation Needs: Not on file  Physical Activity: Not on file  Stress: Not on file  Social Connections: Not on file  Intimate Partner Violence: Not on file    Allergies:  No Known Allergies  Medications: Prior to Admission  medications   Medication Sig Start Date End Date Taking? Authorizing Provider  cyclobenzaprine (FLEXERIL) 10 MG tablet Take 1 tablet (10 mg total) by mouth 3 (three) times daily as needed for muscle spasms. Patient not taking: Reported on 09/10/2021 08/27/21   Mirna Mires, CNM  labetalol (NORMODYNE) 200 MG tablet Take 2 tablets (400 mg total) by mouth 2 (two) times daily. 09/06/21   Nadara Mustard, MD  NIFEdipine (PROCARDIA XL/NIFEDICAL-XL) 90 MG 24 hr tablet Take 1 tablet (90 mg total) by mouth daily. 09/07/21   Nadara Mustard, MD   oxyCODONE-acetaminophen (PERCOCET) 5-325 MG tablet Take 1 tablet by mouth every 4 (four) hours as needed for moderate pain or severe pain. Patient not taking: Reported on 09/10/2021 09/06/21   Nadara Mustard, MD  Prenatal MV & Min w/FA-DHA (PRENATAL GUMMIES PO) Take by mouth.    [provider]    Physical Exam Vitals:  Vitals:   10/22/21 1538  BP: 118/70    Physical Exam Constitutional:      Appearance: Normal appearance. She is well-developed.  HENT:     Head: Normocephalic and atraumatic.  Eyes:     Extraocular Movements: Extraocular movements intact.     Pupils: Pupils are equal, round, and reactive to light.  Neck:     Thyroid: No thyromegaly.  Cardiovascular:     Rate and Rhythm: Normal rate and regular rhythm.     Heart sounds: Normal heart sounds.  Pulmonary:     Effort: Pulmonary effort is normal.     Breath sounds: Normal breath sounds.  Abdominal:     General: Bowel sounds are normal. There is no distension.     Palpations: Abdomen is soft. There is no mass.  Musculoskeletal:     Cervical back: Neck supple.  Neurological:     Mental Status: She is alert and oriented to person, place, and time.  Skin:    General: Skin is warm and dry.  Psychiatric:        Behavior: Behavior normal.        Thought Content: Thought content normal.        Judgment: Judgment normal.  Vitals reviewed.    IUD PROCEDURE NOTE:  Crystal Choi is a 35 y.o. [phone] here for IUD insertion. No GYN concerns.  Last pap smear normal . Pregnancy excluded: reports abstinence since delivery  IUD Insertion Procedure Note Patient identified, informed consent performed, consent signed.   Discussed risks of irregular bleeding, cramping, infection, malpositioning or misplacement of the IUD outside the uterus which may require further procedure such as laparoscopy, risk of failure <1%. Time out was performed.    A bimanual exam showed the uterus to be anteverted.  Speculum  placed in the vagina.  Cervix visualized.  Cleaned with Betadine x 2.  Grasped anteriorly with a single tooth tenaculum.  Uterus sounded to 12 cm.   Paragard IUD placed per manufacturer's recommendations.  Strings trimmed to 3 cm. Tenaculum was removed, good hemostasis noted.  Patient tolerated procedure well.   Patient was given post-procedure instructions.  She was advised to have backup contraception for one week.  Patient was also asked to check IUD strings periodically and follow up in 4 weeks for IUD check.    Assessment: 35 y.o. [id number] presenting for 6 week postpartum visit  Plan: Problem List Items Addressed This Visit   None Visit Diagnoses     Postpartum care and examination    -  Primary  Encounter for IUD insertion       Relevant Orders   POCT urine pregnancy       1) Contraception-  Copper IUD inserted    2)  Pap: up to date  3) Patient underwent screening for postpartum depression with no concerns noted.  - Follow up in 4 weeks   Adrian Prows MD, [place], [facility] 10/22/2021 3:50 PM

## 2021-10-23 NOTE — Telephone Encounter (Signed)
Paragard rcvd/charged 10/22/2021

## 2021-11-06 ENCOUNTER — Encounter: Payer: Self-pay | Admitting: Advanced Practice Midwife

## 2021-11-06 ENCOUNTER — Ambulatory Visit (INDEPENDENT_AMBULATORY_CARE_PROVIDER_SITE_OTHER): Payer: Medicaid Other | Admitting: Advanced Practice Midwife

## 2021-11-06 ENCOUNTER — Other Ambulatory Visit: Payer: Self-pay

## 2021-11-06 VITALS — BP 140/98 | Ht 65.0 in | Wt 248.0 lb

## 2021-11-06 DIAGNOSIS — T7840XA Allergy, unspecified, initial encounter: Secondary | ICD-10-CM

## 2021-11-06 DIAGNOSIS — R21 Rash and other nonspecific skin eruption: Secondary | ICD-10-CM

## 2021-11-09 ENCOUNTER — Encounter: Payer: Self-pay | Admitting: Advanced Practice Midwife

## 2021-11-09 NOTE — Progress Notes (Signed)
Patient ID: Crystal Choi, female   DOB: 09-May-1986, 36 y.o.   MRN: [id number]  Reason for Consult: Rash   Subjective:  Date of Service: 11/06/2021  HPI:  Crystal Choi is a 36 y.o. female being seen for concern of possible allergic reaction to her Paragard Copper IUD.  She had the IUD placed 2 weeks ago. In the past few days she has experienced a rash on her abdomen and irritation with drainage in her umbilicus. She denies any change of body care products or new foods or other possible contact dermatitis. She denies previous allergy to copper. She mentions some pain and spotting after intercourse recently. We discussed that while it is not impossible for her to have a skin reaction to her new copper IUD, it is unlikely. She prefers to keep IUD at this time and she has string check appointment scheduled.  Past Medical History:  Diagnosis Date   Family history of ovarian cancer 07/2021   cancer genetic testing letter sent   Medical history non-contributory    No known health problems    Family History  Problem Relation Age of Onset   Hypertension Mother    Diabetes Father    Hypertension Father    Hypertension Sister    Hypertension Maternal Grandfather    Ovarian cancer Paternal Grandmother    Past Surgical History:  Procedure Laterality Date   CESAREAN SECTION     CESAREAN SECTION  09/03/2021   Procedure: CESAREAN SECTION;  Surgeon: Homero Fellers, MD;  Location: [facility] ORS;  Service: Obstetrics;;   WRIST FRACTURE SURGERY      Short Social History:  Social History   Tobacco Use   Smoking status: Former    Types: Cigarettes    Quit date: 03/07/2021    Years since quitting: 0.6   Smokeless tobacco: Never  Substance Use Topics   Alcohol use: Not Currently    Comment: a little bit    No Known Allergies  Current Outpatient Medications  Medication Sig Dispense Refill   NIFEdipine (PROCARDIA XL/NIFEDICAL-XL) 90 MG 24 hr tablet Take 1 tablet (90  mg total) by mouth daily. 30 tablet 2   PARAGARD INTRAUTERINE COPPER IU by Intrauterine route.     Prenatal MV & Min w/FA-DHA (PRENATAL GUMMIES PO) Take by mouth.     No current facility-administered medications for this visit.    Review of Systems  Constitutional:  Negative for chills and fever.  HENT:  Negative for congestion, ear discharge, ear pain, hearing loss, sinus pain and sore throat.   Eyes:  Negative for blurred vision and double vision.  Respiratory:  Negative for cough, shortness of breath and wheezing.   Cardiovascular:  Negative for chest pain, palpitations and leg swelling.  Gastrointestinal:  Negative for abdominal pain, blood in stool, constipation, diarrhea, heartburn, melena, nausea and vomiting.  Genitourinary:  Negative for dysuria, flank pain, frequency, hematuria and urgency.       Positive for pain/bleeding after intercourse  Musculoskeletal:  Negative for back pain, joint pain and myalgias.  Skin:  Positive for rash. Negative for itching.  Neurological:  Negative for dizziness, tingling, tremors, sensory change, speech change, focal weakness, seizures, loss of consciousness, weakness and headaches.  Endo/Heme/Allergies:  Negative for environmental allergies. Does not bruise/bleed easily.  Psychiatric/Behavioral:  Negative for depression, hallucinations, memory loss, substance abuse and suicidal ideas. The patient is not nervous/anxious and does not have insomnia.        Objective:  Objective  Vitals:   11/06/21 1428  BP: (!) 140/98  Weight: 248 lb (112.5 kg)  Height: 5\' 5"  (1.651 m)   Body mass index is 41.27 kg/m. Constitutional: Well nourished, well developed female in no acute distress.  HEENT: normal Skin: Warm and dry. Scant diffuse red pin point rash across abdomen above U. U with irritated/raw appearance and evidence of prior serous drainage. No active drainage.  Cardiovascular: Regular rate and rhythm.   Extremity:  no edema   Respiratory:  Clear to auscultation bilateral. Normal respiratory effort Abdomen: obese, no masses Neuro: DTRs 2+, Cranial nerves grossly intact Psych: Alert and Oriented x3. No memory deficits. Normal mood and affect.  MS: normal gait, normal bilateral lower extremity ROM/strength/stability.  Pelvic exam: deferred   Time spent caring for patient: 15 minutes Assessment/Plan:     36 year old with probable allergic reaction of unknown origin.   Follow up with dermatology as needed for worsening symptoms Removal of IUD as needed/desired   [facility] 11/09/2021, 1:39 PM

## 2021-11-28 ENCOUNTER — Ambulatory Visit: Payer: Medicaid Other | Admitting: Obstetrics and Gynecology

## 2021-12-18 ENCOUNTER — Other Ambulatory Visit: Payer: Self-pay | Admitting: Obstetrics & Gynecology

## 2023-01-25 ENCOUNTER — Encounter: Payer: Self-pay | Admitting: *Deleted

## 2023-01-25 ENCOUNTER — Emergency Department: Payer: Medicaid Other

## 2023-01-25 ENCOUNTER — Other Ambulatory Visit: Payer: Self-pay

## 2023-01-25 ENCOUNTER — Emergency Department
Admission: EM | Admit: 2023-01-25 | Discharge: 2023-01-25 | Disposition: A | Discharge status: Home / Self Care | Payer: Medicaid Other | Attending: Emergency Medicine | Admitting: Emergency Medicine

## 2023-01-25 DIAGNOSIS — S86911A Strain of unspecified muscle(s) and tendon(s) at lower leg level, right leg, initial encounter: Secondary | ICD-10-CM | POA: Diagnosis not present

## 2023-01-25 DIAGNOSIS — I10 Essential (primary) hypertension: Secondary | ICD-10-CM | POA: Diagnosis not present

## 2023-01-25 DIAGNOSIS — S0181XA Laceration without foreign body of other part of head, initial encounter: Secondary | ICD-10-CM | POA: Diagnosis not present

## 2023-01-25 DIAGNOSIS — S8991XA Unspecified injury of right lower leg, initial encounter: Secondary | ICD-10-CM | POA: Diagnosis present

## 2023-01-25 MED ORDER — LIDOCAINE-EPINEPHRINE-TETRACAINE (LET) SOLUTION
3.0000 mL | Freq: Once | NASAL | Status: AC
  Administered 2023-01-25: 3 mL via TOPICAL
  Filled 2023-01-25: qty 3
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
## 2023-01-25 NOTE — Discharge Instructions (Addendum)
Take OTC ibuprofen as needed. Apply ice and/or moist heat to any sore muscles or joints. Follow-up with your primary provider as needed.
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
## 2023-02-03 ENCOUNTER — Emergency Department: Payer: Medicaid Other

## 2023-02-03 ENCOUNTER — Emergency Department
Admission: EM | Admit: 2023-02-03 | Discharge: 2023-02-03 | Disposition: A | Discharge status: Home / Self Care | Payer: Medicaid Other | Attending: Emergency Medicine | Admitting: Emergency Medicine

## 2023-02-03 ENCOUNTER — Other Ambulatory Visit: Payer: Self-pay

## 2023-02-03 DIAGNOSIS — S8011XD Contusion of right lower leg, subsequent encounter: Secondary | ICD-10-CM | POA: Diagnosis not present

## 2023-02-03 DIAGNOSIS — M25561 Pain in right knee: Secondary | ICD-10-CM | POA: Diagnosis not present

## 2023-02-03 DIAGNOSIS — S8011XA Contusion of right lower leg, initial encounter: Secondary | ICD-10-CM

## 2023-02-03 DIAGNOSIS — S8991XD Unspecified injury of right lower leg, subsequent encounter: Secondary | ICD-10-CM | POA: Diagnosis present

## 2023-02-03 MED ORDER — OXYCODONE HCL 5 MG PO TABS
5.0000 mg | ORAL_TABLET | Freq: Three times a day (TID) | ORAL | 0 refills | Status: AC | PRN
Start: 2023-02-03 — End: 2024-02-03
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
## 2023-02-03 NOTE — Discharge Instructions (Addendum)
Continue the supportive measures with the brace, crutches, elevation and ice.  Follow-up with your orthopedic provider and the MRI  Please take Tylenol and ibuprofen/Advil for your pain.  It is safe to take them together, or to alternate them every few hours.  Take up to 1000mg  of Tylenol at a time, up to 4 times per day.  Do not take more than 4000 mg of Tylenol in 24 hours.  For ibuprofen, take 400-600 mg, 3 - 4 times per day.  Use the oxycodone as needed for more severe pain or helping with sleep.  Do not drive or operate machinery while taking this medication

## 2023-06-17 ENCOUNTER — Encounter: Payer: Self-pay | Admitting: Emergency Medicine

## 2023-06-17 ENCOUNTER — Other Ambulatory Visit: Payer: Self-pay

## 2023-06-17 ENCOUNTER — Emergency Department: Payer: Medicaid Other

## 2023-06-17 ENCOUNTER — Emergency Department
Admission: EM | Admit: 2023-06-17 | Discharge: 2023-06-17 | Disposition: A | Discharge status: Home / Self Care | Payer: Medicaid Other | Attending: Emergency Medicine | Admitting: Emergency Medicine

## 2023-06-17 DIAGNOSIS — S8002XA Contusion of left knee, initial encounter: Secondary | ICD-10-CM | POA: Insufficient documentation

## 2023-06-17 DIAGNOSIS — S20212A Contusion of left front wall of thorax, initial encounter: Secondary | ICD-10-CM | POA: Insufficient documentation

## 2023-06-17 DIAGNOSIS — M25562 Pain in left knee: Secondary | ICD-10-CM | POA: Diagnosis present

## 2023-06-17 DIAGNOSIS — S8001XA Contusion of right knee, initial encounter: Secondary | ICD-10-CM

## 2023-06-17 DIAGNOSIS — Y92410 Unspecified street and highway as the place of occurrence of the external cause: Secondary | ICD-10-CM | POA: Diagnosis not present

## 2023-06-17 LAB — POC URINE PREG, ED: Preg Test, Ur: NEGATIVE
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
## 2023-06-17 NOTE — Discharge Instructions (Signed)
You may find the results of your x-rays on MyChart. Return for new, worsening, or change in symptoms or other concerns.

## 2023-08-06 ENCOUNTER — Ambulatory Visit: Payer: Medicaid Other | Admitting: Urology
